# Patient Record
Sex: Female | Born: 1950 | Race: White | Hispanic: No | Smoking: Never smoker
Health system: Southern US, Community
[De-identification: ages and names within clinical notes are randomized; demographics above are authoritative.]

## PROBLEM LIST (undated history)

## (undated) DIAGNOSIS — M797 Fibromyalgia: Secondary | ICD-10-CM

## (undated) DIAGNOSIS — I10 Essential (primary) hypertension: Secondary | ICD-10-CM

---

## 2019-11-30 ENCOUNTER — Ambulatory Visit: Payer: Medicare PPO | Admitting: Dermatology

## 2019-11-30 ENCOUNTER — Encounter: Payer: Self-pay | Admitting: Dermatology

## 2019-11-30 ENCOUNTER — Other Ambulatory Visit: Payer: Self-pay

## 2019-11-30 DIAGNOSIS — I8310 Varicose veins of unspecified lower extremity with inflammation: Secondary | ICD-10-CM

## 2019-11-30 DIAGNOSIS — Z1283 Encounter for screening for malignant neoplasm of skin: Secondary | ICD-10-CM | POA: Diagnosis not present

## 2019-11-30 DIAGNOSIS — L814 Other melanin hyperpigmentation: Secondary | ICD-10-CM | POA: Diagnosis not present

## 2019-11-30 DIAGNOSIS — D1800 Hemangioma unspecified site: Secondary | ICD-10-CM

## 2019-11-30 DIAGNOSIS — L578 Other skin changes due to chronic exposure to nonionizing radiation: Secondary | ICD-10-CM

## 2019-11-30 DIAGNOSIS — L820 Inflamed seborrheic keratosis: Secondary | ICD-10-CM

## 2019-11-30 DIAGNOSIS — D229 Melanocytic nevi, unspecified: Secondary | ICD-10-CM

## 2019-11-30 DIAGNOSIS — L821 Other seborrheic keratosis: Secondary | ICD-10-CM

## 2019-11-30 NOTE — Progress Notes (Signed)
   Follow-Up Visit   Subjective  Susan Kim is a 69 y.o. female who presents for the following: Annual Exam (Pt present for TBSE, no known hx of skin cancer ) and Skin Problem (check lesions on the face and back growing ). The patient presents for Total-Body Skin Exam (TBSE) for skin cancer screening and mole check.  The following portions of the chart were reviewed this encounter and updated as appropriate:  Allergies  Meds  Problems  Med Hx  Surg Hx  Fam Hx     Review of Systems:  No other skin or systemic complaints except as noted in HPI or Assessment and Plan.  Objective  Well appearing patient in no apparent distress; mood and affect are within normal limits.  A full examination was performed including scalp, head, eyes, ears, nose, lips, neck, chest, axillae, abdomen, back, buttocks, bilateral upper extremities, bilateral lower extremities, hands, feet, fingers, toes, fingernails, and toenails. All findings within normal limits unless otherwise noted below.  Objective  Right dorsum wrist, L side, R lower eyelid x 1, L cheek (4): Erythematous keratotic or waxy stuck-on papule or plaque.    Assessment & Plan  Inflamed seborrheic keratosis (4) Right dorsum wrist, L side, R lower eyelid x 1, L cheek  Destruction of lesion - Right dorsum wrist, L side, R lower eyelid x 1, L cheek Complexity: simple   Destruction method: cryotherapy   Informed consent: discussed and consent obtained   Timeout:  patient name, date of birth, surgical site, and procedure verified Lesion destroyed using liquid nitrogen: Yes   Region frozen until ice ball extended beyond lesion: Yes   Outcome: patient tolerated procedure well with no complications   Post-procedure details: wound care instructions given    Skin cancer screening   Lentigines - Scattered tan macules - Discussed due to sun exposure - Benign, observe - Call for any changes  Seborrheic Keratoses - Stuck-on, waxy,  tan-brown papules and plaques  - Discussed benign etiology and prognosis. - Observe - Call for any changes  Melanocytic Nevi - Tan-brown and/or pink-flesh-colored symmetric macules and papules - Benign appearing on exam today - Observation - Call clinic for new or changing moles - Recommend daily use of broad spectrum spf 30+ sunscreen to sun-exposed areas.   Hemangiomas - Red papules - Discussed benign nature - Observe - Call for any changes  Actinic Damage - diffuse scaly erythematous macules with underlying dyspigmentation - Recommend daily broad spectrum sunscreen SPF 30+ to sun-exposed areas, reapply every 2 hours as needed.  - Call for new or changing lesions.  Skin cancer screening performed today.  Varicose Veins - Dilated blue, purple or red veins at the lower extremities - Reassured - These can be treated by sclerotherapy (a procedure to inject a medicine into the veins to make them disappear) if desired, but the treatment is not covered by insurance  Return in about 1 year (around 11/29/2020) for TBSE .  IMarye Round, CMA, am acting as scribe for Sarina Ser, MD .  Documentation: I have reviewed the above documentation for accuracy and completeness, and I agree with the above.  Sarina Ser, MD

## 2019-11-30 NOTE — Patient Instructions (Addendum)
Cryotherapy Aftercare  . Wash gently with soap and water everyday.   . Apply Vaseline and Band-Aid daily until healed. Recommend daily broad spectrum sunscreen SPF 30+ to sun-exposed areas, reapply every 2 hours as needed. Call for new or changing lesions.  

## 2020-06-11 ENCOUNTER — Ambulatory Visit: Payer: Medicare PPO | Admitting: Dermatology

## 2020-06-11 ENCOUNTER — Other Ambulatory Visit: Payer: Self-pay

## 2020-06-11 ENCOUNTER — Encounter: Payer: Self-pay | Admitting: Dermatology

## 2020-06-11 DIAGNOSIS — L820 Inflamed seborrheic keratosis: Secondary | ICD-10-CM

## 2020-06-11 DIAGNOSIS — L821 Other seborrheic keratosis: Secondary | ICD-10-CM

## 2020-06-11 DIAGNOSIS — L578 Other skin changes due to chronic exposure to nonionizing radiation: Secondary | ICD-10-CM | POA: Diagnosis not present

## 2020-06-11 NOTE — Progress Notes (Signed)
   Follow-Up Visit   Subjective  Susan Kim is a 70 y.o. female who presents for the following: Irritated seborrheic keratosis (Of the L cheek - previously tx with LN2, but didn't resolve and patient would like it checked again. ) and new skin lesion (R chest area ).  The following portions of the chart were reviewed this encounter and updated as appropriate:   Allergies  Meds  Problems  Med Hx  Surg Hx  Fam Hx     Review of Systems:  No other skin or systemic complaints except as noted in HPI or Assessment and Plan.  Objective  Well appearing patient in no apparent distress; mood and affect are within normal limits.  A focused examination was performed including the face and chest . Relevant physical exam findings are noted in the Assessment and Plan.  Objective  R lat clavicle x 1, L cheek x 1 (2): Erythematous keratotic or waxy stuck-on papule or plaque.   Assessment & Plan  Inflamed seborrheic keratosis (2) R lat clavicle x 1, L cheek x 1  Destruction of lesion - R lat clavicle x 1, L cheek x 1 Complexity: simple   Destruction method: cryotherapy   Informed consent: discussed and consent obtained   Timeout:  patient name, date of birth, surgical site, and procedure verified Lesion destroyed using liquid nitrogen: Yes   Region frozen until ice ball extended beyond lesion: Yes   Outcome: patient tolerated procedure well with no complications   Post-procedure details: wound care instructions given     Seborrheic Keratoses - Stuck-on, waxy, tan-brown papules and/or plaques  - Benign-appearing - Discussed benign etiology and prognosis. - Observe - Call for any changes  Actinic Damage - chronic, secondary to cumulative UV radiation exposure/sun exposure over time - diffuse scaly erythematous macules with underlying dyspigmentation - Recommend daily broad spectrum sunscreen SPF 30+ to sun-exposed areas, reapply every 2 hours as needed.  - Recommend staying in the  shade or wearing long sleeves, sun glasses (UVA+UVB protection) and wide brim hats (4-inch brim around the entire circumference of the hat). - Call for new or changing lesions.  Return for appointment as scheduled - TBSE .  Luther Redo, CMA, am acting as scribe for Sarina Ser, MD .  Documentation: I have reviewed the above documentation for accuracy and completeness, and I agree with the above.  Sarina Ser, MD

## 2020-06-11 NOTE — Patient Instructions (Signed)

## 2020-08-23 ENCOUNTER — Other Ambulatory Visit: Payer: Self-pay

## 2020-08-23 ENCOUNTER — Emergency Department: Payer: Medicare PPO

## 2020-08-23 ENCOUNTER — Inpatient Hospital Stay
Admission: EM | Admit: 2020-08-23 | Discharge: 2020-08-31 | DRG: 481 | Disposition: A | Discharge status: Home Health | Payer: Medicare PPO | Attending: Obstetrics and Gynecology | Admitting: Obstetrics and Gynecology

## 2020-08-23 DIAGNOSIS — D649 Anemia, unspecified: Secondary | ICD-10-CM | POA: Diagnosis not present

## 2020-08-23 DIAGNOSIS — Z881 Allergy status to other antibiotic agents status: Secondary | ICD-10-CM | POA: Diagnosis not present

## 2020-08-23 DIAGNOSIS — Z419 Encounter for procedure for purposes other than remedying health state, unspecified: Secondary | ICD-10-CM

## 2020-08-23 DIAGNOSIS — K219 Gastro-esophageal reflux disease without esophagitis: Secondary | ICD-10-CM | POA: Diagnosis present

## 2020-08-23 DIAGNOSIS — K5909 Other constipation: Secondary | ICD-10-CM | POA: Diagnosis present

## 2020-08-23 DIAGNOSIS — D62 Acute posthemorrhagic anemia: Secondary | ICD-10-CM | POA: Diagnosis not present

## 2020-08-23 DIAGNOSIS — G4700 Insomnia, unspecified: Secondary | ICD-10-CM

## 2020-08-23 DIAGNOSIS — Z791 Long term (current) use of non-steroidal anti-inflammatories (NSAID): Secondary | ICD-10-CM | POA: Diagnosis not present

## 2020-08-23 DIAGNOSIS — Z91048 Other nonmedicinal substance allergy status: Secondary | ICD-10-CM

## 2020-08-23 DIAGNOSIS — Z885 Allergy status to narcotic agent status: Secondary | ICD-10-CM

## 2020-08-23 DIAGNOSIS — F32A Depression, unspecified: Secondary | ICD-10-CM | POA: Diagnosis present

## 2020-08-23 DIAGNOSIS — I951 Orthostatic hypotension: Secondary | ICD-10-CM | POA: Diagnosis not present

## 2020-08-23 DIAGNOSIS — Z79899 Other long term (current) drug therapy: Secondary | ICD-10-CM | POA: Diagnosis not present

## 2020-08-23 DIAGNOSIS — G8929 Other chronic pain: Secondary | ICD-10-CM | POA: Diagnosis present

## 2020-08-23 DIAGNOSIS — F5101 Primary insomnia: Secondary | ICD-10-CM

## 2020-08-23 DIAGNOSIS — S72141A Displaced intertrochanteric fracture of right femur, initial encounter for closed fracture: Secondary | ICD-10-CM | POA: Diagnosis present

## 2020-08-23 DIAGNOSIS — M25569 Pain in unspecified knee: Secondary | ICD-10-CM | POA: Diagnosis present

## 2020-08-23 DIAGNOSIS — Z20822 Contact with and (suspected) exposure to covid-19: Secondary | ICD-10-CM | POA: Diagnosis present

## 2020-08-23 DIAGNOSIS — W010XXA Fall on same level from slipping, tripping and stumbling without subsequent striking against object, initial encounter: Secondary | ICD-10-CM | POA: Diagnosis present

## 2020-08-23 DIAGNOSIS — M797 Fibromyalgia: Secondary | ICD-10-CM | POA: Diagnosis present

## 2020-08-23 DIAGNOSIS — S72001A Fracture of unspecified part of neck of right femur, initial encounter for closed fracture: Secondary | ICD-10-CM | POA: Diagnosis present

## 2020-08-23 DIAGNOSIS — I10 Essential (primary) hypertension: Secondary | ICD-10-CM

## 2020-08-23 DIAGNOSIS — F3289 Other specified depressive episodes: Secondary | ICD-10-CM | POA: Diagnosis not present

## 2020-08-23 DIAGNOSIS — Y92009 Unspecified place in unspecified non-institutional (private) residence as the place of occurrence of the external cause: Secondary | ICD-10-CM | POA: Diagnosis not present

## 2020-08-23 DIAGNOSIS — Z79890 Hormone replacement therapy (postmenopausal): Secondary | ICD-10-CM | POA: Diagnosis not present

## 2020-08-23 DIAGNOSIS — F411 Generalized anxiety disorder: Secondary | ICD-10-CM | POA: Diagnosis present

## 2020-08-23 DIAGNOSIS — E538 Deficiency of other specified B group vitamins: Secondary | ICD-10-CM | POA: Diagnosis present

## 2020-08-23 HISTORY — DX: Fibromyalgia: M79.7

## 2020-08-23 HISTORY — DX: Essential (primary) hypertension: I10

## 2020-08-23 LAB — CBC WITH DIFFERENTIAL/PLATELET
Abs Immature Granulocytes: 0.02 10*3/uL (ref 0.00–0.07)
Basophils Absolute: 0 10*3/uL (ref 0.0–0.1)
Basophils Relative: 1 %
Eosinophils Absolute: 0.1 10*3/uL (ref 0.0–0.5)
Eosinophils Relative: 1 %
HCT: 37.3 % (ref 36.0–46.0)
Hemoglobin: 12.7 g/dL (ref 12.0–15.0)
Immature Granulocytes: 0 %
Lymphocytes Relative: 16 %
Lymphs Abs: 0.9 10*3/uL (ref 0.7–4.0)
MCH: 32.7 pg (ref 26.0–34.0)
MCHC: 34 g/dL (ref 30.0–36.0)
MCV: 96.1 fL (ref 80.0–100.0)
Monocytes Absolute: 0.3 10*3/uL (ref 0.1–1.0)
Monocytes Relative: 6 %
Neutro Abs: 4.1 10*3/uL (ref 1.7–7.7)
Neutrophils Relative %: 76 %
Platelets: 235 10*3/uL (ref 150–400)
RBC: 3.88 MIL/uL (ref 3.87–5.11)
RDW: 11.9 % (ref 11.5–15.5)
WBC: 5.4 10*3/uL (ref 4.0–10.5)
nRBC: 0 % (ref 0.0–0.2)

## 2020-08-23 LAB — RESP PANEL BY RT-PCR (FLU A&B, COVID) ARPGX2
Influenza A by PCR: NEGATIVE
Influenza B by PCR: NEGATIVE
SARS Coronavirus 2 by RT PCR: NEGATIVE

## 2020-08-23 LAB — BASIC METABOLIC PANEL
Anion gap: 8 (ref 5–15)
BUN: 19 mg/dL (ref 8–23)
CO2: 26 mmol/L (ref 22–32)
Calcium: 8.7 mg/dL — ABNORMAL LOW (ref 8.9–10.3)
Chloride: 103 mmol/L (ref 98–111)
Creatinine, Ser: 0.69 mg/dL (ref 0.44–1.00)
GFR, Estimated: >60 mL/min (ref >60)
Glucose, Bld: 118 mg/dL — ABNORMAL HIGH (ref 70–99)
Potassium: 3.5 mmol/L (ref 3.5–5.1)
Sodium: 137 mmol/L (ref 135–145)

## 2020-08-23 LAB — SURGICAL PCR SCREEN
MRSA, PCR: NEGATIVE
Staphylococcus aureus: NEGATIVE

## 2020-08-23 LAB — TYPE AND SCREEN
ABO/RH(D): A POS
Antibody Screen: NEGATIVE

## 2020-08-23 LAB — PROTIME-INR
INR: 1 (ref 0.8–1.2)
Prothrombin Time: 12.7 seconds (ref 11.4–15.2)

## 2020-08-23 MED ORDER — CEFAZOLIN SODIUM-DEXTROSE 2-4 GM/100ML-% IV SOLN
2.0000 g | INTRAVENOUS | Status: AC
  Administered 2020-08-24: 2 g via INTRAVENOUS

## 2020-08-23 MED ORDER — PANTOPRAZOLE SODIUM 40 MG PO TBEC
80.0000 mg | DELAYED_RELEASE_TABLET | Freq: Every day | ORAL | Status: DC
  Administered 2020-08-24 – 2020-08-31 (×7): 80 mg via ORAL
  Filled 2020-08-23 (×7): qty 2

## 2020-08-23 MED ORDER — MELATONIN 5 MG PO TABS: 5.0000 mg | ORAL_TABLET | Freq: Every day | ORAL | Status: DC

## 2020-08-23 MED ORDER — SENNOSIDES-DOCUSATE SODIUM 8.6-50 MG PO TABS
1.0000 | ORAL_TABLET | Freq: Two times a day (BID) | ORAL | Status: DC
  Administered 2020-08-23: 1 via ORAL
  Filled 2020-08-23: qty 1

## 2020-08-23 MED ORDER — METHOCARBAMOL 500 MG PO TABS: 500.0000 mg | ORAL_TABLET | Freq: Four times a day (QID) | ORAL | Status: DC | PRN

## 2020-08-23 MED ORDER — MORPHINE SULFATE (PF) 4 MG/ML IV SOLN
4.0000 mg | Freq: Once | INTRAVENOUS | Status: AC
  Administered 2020-08-23: 4 mg via INTRAVENOUS
  Filled 2020-08-23: qty 1

## 2020-08-23 MED ORDER — MELATONIN 5 MG PO TABS
5.0000 mg | ORAL_TABLET | Freq: Every day | ORAL | Status: DC
  Administered 2020-08-23 – 2020-08-30 (×8): 5 mg via ORAL
  Filled 2020-08-23 (×8): qty 1

## 2020-08-23 MED ORDER — TRANEXAMIC ACID-NACL 1000-0.7 MG/100ML-% IV SOLN
1000.0000 mg | INTRAVENOUS | Status: AC
  Administered 2020-08-24: 1000 mg via INTRAVENOUS
  Filled 2020-08-23: qty 100

## 2020-08-23 MED ORDER — OXYCODONE-ACETAMINOPHEN 5-325 MG PO TABS: 1.0000 | ORAL_TABLET | Freq: Four times a day (QID) | ORAL | Status: DC | PRN

## 2020-08-23 MED ORDER — DULOXETINE HCL 60 MG PO CPEP
60.0000 mg | ORAL_CAPSULE | Freq: Every day | ORAL | Status: DC
  Administered 2020-08-23: 60 mg via ORAL
  Filled 2020-08-23: qty 1

## 2020-08-23 MED ORDER — BUPROPION HCL ER (XL) 150 MG PO TB24
300.0000 mg | ORAL_TABLET | Freq: Every day | ORAL | Status: DC
  Administered 2020-08-24 – 2020-08-31 (×8): 300 mg via ORAL
  Filled 2020-08-23 (×9): qty 2

## 2020-08-23 MED ORDER — MORPHINE SULFATE (PF) 2 MG/ML IV SOLN
2.0000 mg | Freq: Once | INTRAVENOUS | Status: AC
  Administered 2020-08-23: 2 mg via INTRAVENOUS
  Filled 2020-08-23: qty 1

## 2020-08-23 MED ORDER — CYANOCOBALAMIN 500 MCG PO TABS
500.0000 ug | ORAL_TABLET | Freq: Every day | ORAL | Status: DC
  Filled 2020-08-23: qty 1

## 2020-08-23 MED ORDER — OXYCODONE-ACETAMINOPHEN 5-325 MG PO TABS
1.0000 | ORAL_TABLET | Freq: Four times a day (QID) | ORAL | Status: DC | PRN
  Administered 2020-08-23: 1 via ORAL
  Filled 2020-08-23 (×2): qty 1

## 2020-08-23 MED ORDER — HYDROCODONE-ACETAMINOPHEN 5-325 MG PO TABS: 1.0000 | ORAL_TABLET | Freq: Four times a day (QID) | ORAL | Status: DC | PRN

## 2020-08-23 MED ORDER — HEPARIN SODIUM (PORCINE) 5000 UNIT/ML IJ SOLN
5000.0000 [IU] | Freq: Three times a day (TID) | INTRAMUSCULAR | Status: DC
  Administered 2020-08-23: 5000 [IU] via SUBCUTANEOUS
  Filled 2020-08-23: qty 1

## 2020-08-23 MED ORDER — VITAMIN D 25 MCG (1000 UNIT) PO TABS
2000.0000 [IU] | ORAL_TABLET | Freq: Every day | ORAL | Status: DC
  Administered 2020-08-23: 2000 [IU] via ORAL
  Filled 2020-08-23: qty 2

## 2020-08-23 MED ORDER — POLYETHYLENE GLYCOL 3350 17 G PO PACK: 17.0000 g | PACK | Freq: Every day | ORAL | Status: DC

## 2020-08-23 MED ORDER — ALBUTEROL SULFATE (2.5 MG/3ML) 0.083% IN NEBU: 3.0000 mL | INHALATION_SOLUTION | Freq: Four times a day (QID) | RESPIRATORY_TRACT | Status: DC | PRN

## 2020-08-23 MED ORDER — HYDROCHLOROTHIAZIDE 25 MG PO TABS
25.0000 mg | ORAL_TABLET | Freq: Every day | ORAL | Status: DC
  Administered 2020-08-25 – 2020-08-26 (×2): 25 mg via ORAL
  Filled 2020-08-23 (×3): qty 1

## 2020-08-23 MED ORDER — MELATONIN 5 MG PO TABS: 5.0000 mg | ORAL_TABLET | Freq: Every evening | ORAL | Status: DC | PRN

## 2020-08-23 MED ORDER — FAMOTIDINE 20 MG PO TABS
20.0000 mg | ORAL_TABLET | Freq: Every day | ORAL | Status: DC
  Administered 2020-08-25 – 2020-08-31 (×7): 20 mg via ORAL
  Filled 2020-08-23 (×7): qty 1

## 2020-08-23 MED ORDER — METHOCARBAMOL 500 MG PO TABS
500.0000 mg | ORAL_TABLET | ORAL | Status: DC | PRN
  Administered 2020-08-23: 500 mg via ORAL
  Filled 2020-08-23: qty 1

## 2020-08-23 MED ORDER — MORPHINE SULFATE (PF) 2 MG/ML IV SOLN
1.0000 mg | INTRAVENOUS | Status: DC | PRN
Start: 2020-08-23 — End: 2020-08-24
  Administered 2020-08-23 – 2020-08-24 (×3): 1 mg via INTRAVENOUS
  Filled 2020-08-23 (×3): qty 1

## 2020-08-23 NOTE — ED Triage Notes (Signed)
Pt BIBA from home for a mechanical fall today.Pt tripped over a rug and landed on her right hip. Pt c/o 10/10 right hip pain on arrival. Ems gave 100 mcg fentanyl and 4 mg zofran. Pt denies LOC.

## 2020-08-23 NOTE — H&P (Signed)
History and Physical   Susan Kim XBJ:[id number] DOB: [birth year] DOA: 08/23/2020  PCP: Conni Elliot, MD  Outpatient Specialists: Dr. Frutoso Chase (GI) Patient coming from: home  I have personally briefly reviewed patient's old medical records in [place].  Chief Concern: Mechanical fall  HPI: Susan Kim is a 70 y.o. female with medical history significant for depression, generalized anxiety disorder, insomnia, constipation, migraine headaches, GERD, hypertension, presents to the emergency department for chief concerns of fall.  Patient tripped on her rug that was overturned and fell on her right hip.  She denies loss of consciousness, head trauma, syncope, neck or head pain.  Patient reports that the pain initially was a 10 out of 10 and persistent since her fall.  After pain medication, she states that the pain is 8 out of 10.  Prior to falling she denies chest pain, shortness of breath, abdominal pain, vision changes, weakness.  Social history: She is currently retired, formerly worked as an Therapist, sports. She denies tobacco use. Endorses infrequent etoh and denies recreational drug use.   Vaccinations: three doses of Moderna  ROS: Constitutional: no weight change, no fever ENT/Mouth: no sore throat, no rhinorrhea Eyes: no eye pain, no vision changes Cardiovascular: no chest pain, no dyspnea,  no edema, no palpitations Respiratory: no cough, no sputum, no wheezing Gastrointestinal: no nausea, no vomiting, no diarrhea, no constipation Genitourinary: no urinary incontinence, no dysuria, no hematuria Musculoskeletal: no arthralgias, + myalgias Skin: no skin lesions, no pruritus, Neuro: + weakness, no loss of consciousness, no syncope Psych: no anxiety, no depression, + decrease appetite Heme/Lymph: no bruising, no bleeding  ED Course: Discussed with ED provider, patient requiring hospitalization for right hip fracture.  Vitals in the emergency department initially  showed a temperature of 98.8, respiration rate of 18, heart rate of 80, blood pressure 129/98, SPO2 of 99% on room air.  Patient is status post 100 mcg of fentanyl and 10 mg of IV morphine per EDP.  ED provider called orthopedic surgery, Dr. Posey Pronto who will see the patient.  Assessment/Plan  Active Problems:   Closed right hip fracture, initial encounter [facility])   Essential hypertension   Insomnia   Acute right intertrochanteric fracture-present admission secondary to mechanical fall - Morphine 1 mg every 2 hours as needed for severe pain, oxycodone-acetaminophen 5-3 25, 1 to 2 tablets, daily 6 hours as needed for moderate pain - Cefazolin IV per Ortho - N.p.o. after midnight - Patient will need PT, OT after orthopedic intervention  Depression/anxiety-bupropion 300 mg daily resumed, duloxetine 60 mg nightly resumed  Hypertension-hydrochlorothiazide 25 mg p.o. daily resumed  GERD-famotidine 20 mg daily, pantoprazole 80 mg daily resumed  Chronic constipation-senna docusate 1 tablet twice daily, GlycoLax daily resumed  Insomnia-melatonin nightly ordered  Muscle spasm-Robaxin 500 mg p.o. as needed every 6 hours for muscle spasm  Patient has chronic knee pain and takes daily meloxicam, I have extensively discussed with patient that daily NSAID is not recommended due to risk of GI ulcer - Discussed with patient and son to alternate 1 day taking NSAID in the next day taking acetaminophen -Patient and son endorses understanding and compliance  History of B12 deficiency-B12 500 mcg p.o. Home medication resumed  DVT prophylaxis-I ordered heparin 5000 units, every 8 hours, 2 doses in anticipation of orthopedic procedure in the a.m. - A.m. team to resume pharmacologic DVT prophylaxis when appropriate  Chart reviewed.   DVT prophylaxis: Heparin 5000 units, every 8 hours, 2 doses ordered Code Status: Full code Diet:  Heart healthy, n.p.o. at midnight Family Communication: Updated son at  bedside Disposition Plan: Pending clinical course Consults called: Orthopedic Admission status: MedSurg, inpatient, no telemetry ordered  Past Medical History:  Diagnosis Date   Fibromyalgia    Hypertension    History reviewed. No pertinent surgical history.  Social History:  reports that she has never smoked. She has never used smokeless tobacco. No history on file for alcohol use and drug use.  Allergies  Allergen Reactions   Erythromycin Base Nausea And Vomiting   Hydrocodone-Acetaminophen Nausea And Vomiting and Nausea Only   Other Rash    Silk tape-paper tape OK-slik tape burns and blisters   History reviewed. No pertinent family history. Family history: Family history reviewed and not pertinent  Prior to Admission medications   Medication Sig Start Date End Date Taking? Authorizing Provider  buPROPion (WELLBUTRIN XL) 150 MG 24 hr tablet Take 150 mg by mouth daily.    [provider]  Calcium Carbonate-Vitamin D (OYSTER SHELL CALCIUM/D) 250-125 MG-UNIT TABS Take 1 tablet by mouth daily.    [provider]  DULoxetine (CYMBALTA) 30 MG capsule Take by mouth. 02/18/18   [provider]  famotidine (PEPCID) 20 MG tablet Take by mouth.    [provider]  hydrochlorothiazide (HYDRODIURIL) 25 MG tablet Take 1 tablet by mouth daily. 11/29/19   [provider]  loratadine (CLARITIN) 10 MG tablet Take by mouth.    [provider]  melatonin 1 MG TABS tablet Take by mouth.    [provider]  meloxicam (MOBIC) 15 MG tablet Take by mouth. 04/30/16   [provider]  methocarbamol (ROBAXIN) 500 MG tablet Take by mouth.    [provider]  Misc Natural Products (TURMERIC CURCUMIN) CAPS Take by mouth.    [provider]  ondansetron (ZOFRAN) 4 MG tablet Take by mouth.    [provider]  SUMAtriptan (IMITREX) 25 MG tablet Take by mouth.    [provider]  vitamin B-12  (CYANOCOBALAMIN) 500 MCG tablet Take by mouth.    [provider]  vitamin E 1000 UNIT capsule Take 1,000 Units by mouth daily.    [provider]   Physical Exam: Vitals:   08/23/20 1316 08/23/20 1551 08/23/20 1659 08/23/20 2040  BP: (!) 129/91 (!) 129/98 124/69 (!) 128/57  Pulse: 84 80 87 91  Resp: 15 18 18 18   Temp:  98.8 F (37.1 C) 98 F (36.7 C) 98.1 F (36.7 C)  TempSrc:  Oral Oral Oral  SpO2: 96% 99% 100% 100%  Weight:      Height:       Constitutional: appears age-appropriate, NAD, calm, comfortable Eyes: PERRL, lids and conjunctivae normal ENMT: Mucous membranes are moist. Posterior pharynx clear of any exudate or lesions. Age-appropriate dentition. Hearing appropriate Neck: normal, supple, no masses, no thyromegaly Respiratory: clear to auscultation bilaterally, no wheezing, no crackles. Normal respiratory effort. No accessory muscle use.  Cardiovascular: Regular rate and rhythm, no murmurs / rubs / gallops. No extremity edema. 2+ pedal pulses. No carotid bruits.  Abdomen: Abdominal pannus present, no tenderness, no masses palpated, no hepatosplenomegaly. Bowel sounds positive.  Musculoskeletal: no clubbing / cyanosis. No joint deformity upper and lower extremities. no contractures, no atrophy. Normal muscle tone.  Right hip pain, decreased range of motion in the right lower extremity Skin: no rashes, lesions, ulcers. No induration Neurologic: Sensation intact. Strength 5/5 in all 4.  Psychiatric: Normal judgment and insight. Alert and oriented x 3.  Normal mood.   EKG: Not indicated  Chest x-ray on Admission: I personally reviewed and I agree with radiologist reading as below.  DG Chest 1 View  Result Date: 08/23/2020 CLINICAL DATA:  RIGHT hip fracture, fell today EXAM: CHEST  1 VIEW COMPARISON:  None FINDINGS: Upper normal heart size. Mediastinal contours and pulmonary vascularity normal. Calcified granulomata in both lungs with calcified lymph nodes  at LEFT hilum. Lungs otherwise clear. No acute infiltrate, pleural effusion, or pneumothorax. Bones appear demineralized. IMPRESSION: Old granulomatous disease. No acute abnormalities. Electronically Signed   By: Lavonia Dana M.D.   On: 08/23/2020 14:08   DG Hip Unilat W or Wo Pelvis 2-3 Views Right  Result Date: 08/23/2020 CLINICAL DATA:  Right hip pain after a fall today. EXAM: DG HIP (WITH OR WITHOUT PELVIS) 2-3V RIGHT COMPARISON:  None. FINDINGS: The patient has an acute right intertrochanteric fracture. The fracture is mildly comminuted with the lesser trochanter a separate fragment. No other acute abnormality. IMPRESSION: Acute right intertrochanteric fracture. Electronically Signed   By: Inge Rise M.D.   On: 08/23/2020 14:07    Labs on Admission: I have personally reviewed following labs  CBC: Recent Labs  Lab 08/23/20 1156  WBC 5.4  NEUTROABS 4.1  HGB 12.7  HCT 37.3  MCV 96.1  PLT 810   Basic Metabolic Panel: Recent Labs  Lab 08/23/20 1156  NA 137  K 3.5  CL 103  CO2 26  GLUCOSE 118*  BUN 19  CREATININE 0.69  CALCIUM 8.7*   GFR: Estimated Creatinine Clearance: 62 mL/min (by C-G formula based on SCr of 0.69 mg/dL).  Recent Labs  Lab 08/23/20 1156  INR 1.0   Leetta Hendriks N Monaca Wadas D.O. [facility]  If 7PM-7AM, please contact overnight-coverage provider If 7AM-7PM, please contact day coverage provider [email or web address]  08/23/2020, 11:43 PM

## 2020-08-23 NOTE — ED Provider Notes (Signed)
[facility] Emergency Department Provider Note  ____________________________________________   Event Date/Time   First MD Initiated Contact with Patient 08/23/20 1148     (approximate)  I have reviewed the triage vital signs and the nursing notes.   HISTORY  Chief Complaint Fall   HPI Susan Kim is a 70 y.o. female with a past medical history of fibromyalgia and hypertension who presents via EMS from home for assessment of right hip pain after patient states she slipped on a rug falling onto her right hip.  Patient states she did not hit anywhere else including her right knee left hip, ankles, arms, head or neck.  States she did not pass out and has not been feeling dizzy, weak, lightheaded and has not had any recent chest pain, cough, shortness of breath, vomiting, diarrhea, dysuria, rash or any other recent injuries or falls.  She is not on any blood thinners.  She denies any other pain or signs or symptoms other than in her right hip.  She did receive 100 mcg of fentanyl and 4 mg of Zofran with EMS prior to arrival.  She states her pain is still 10/10 intensity.         Past Medical History:  Diagnosis Date   Fibromyalgia    Hypertension     There are no problems to display for this patient.   History reviewed. No pertinent surgical history.  Prior to Admission medications   Medication Sig Start Date End Date Taking? Authorizing Provider  buPROPion (WELLBUTRIN XL) 150 MG 24 hr tablet Take 150 mg by mouth daily.    [provider]  Calcium Carbonate-Vitamin D (OYSTER SHELL CALCIUM/D) 250-125 MG-UNIT TABS Take 1 tablet by mouth daily.    [provider]  DULoxetine (CYMBALTA) 30 MG capsule Take by mouth. 02/18/18   [provider]  famotidine (PEPCID) 20 MG tablet Take by mouth.    [provider]  hydrochlorothiazide (HYDRODIURIL) 25 MG tablet Take 1 tablet by mouth daily. 11/29/19   [provider]   loratadine (CLARITIN) 10 MG tablet Take by mouth.    [provider]  melatonin 1 MG TABS tablet Take by mouth.    [provider]  meloxicam (MOBIC) 15 MG tablet Take by mouth. 04/30/16   [provider]  methocarbamol (ROBAXIN) 500 MG tablet Take by mouth.    [provider]  Misc Natural Products (TURMERIC CURCUMIN) CAPS Take by mouth.    [provider]  ondansetron (ZOFRAN) 4 MG tablet Take by mouth.    [provider]  SUMAtriptan (IMITREX) 25 MG tablet Take by mouth.    [provider]  vitamin B-12 (CYANOCOBALAMIN) 500 MCG tablet Take by mouth.    [provider]  vitamin E 1000 UNIT capsule Take 1,000 Units by mouth daily.    [provider]    Allergies Erythromycin base, Hydrocodone-acetaminophen, and Other  History reviewed. No pertinent family history.  Social History Social History   Tobacco Use   Smoking status: Never   Smokeless tobacco: Never    Review of Systems  Review of Systems  Constitutional:  Negative for chills and fever.  HENT:  Negative for sore throat.   Eyes:  Negative for pain.  Respiratory:  Negative for cough and stridor.   Cardiovascular:  Negative for chest pain.  Gastrointestinal:  Negative for vomiting.  Genitourinary:  Negative for dysuria.  Musculoskeletal:  Positive for joint pain (R hip) and myalgias (R hip).  Skin:  Negative for rash.  Neurological:  Negative for seizures, loss of consciousness and headaches.  Psychiatric/Behavioral:  Negative for suicidal ideas.   All other systems reviewed and are negative.    ____________________________________________   PHYSICAL EXAM:  VITAL SIGNS: ED Triage Vitals  Enc Vitals Group     BP 08/23/20 1154 (!) 156/81     Pulse Rate 08/23/20 1154 81     Resp 08/23/20 1154 17     Temp 08/23/20 1154 98.1 F (36.7 C)     Temp Source 08/23/20 1154 Oral     SpO2 08/23/20 1154 99 %     Weight 08/23/20 1152 175  lb (79.4 kg)     Height 08/23/20 1152 5\' 2"  (1.575 m)     Head Circumference --      Peak Flow --      Pain Score 08/23/20 1152 10     Pain Loc --      Pain Edu? --      Excl. in [place]? --    Vitals:   08/23/20 1230 08/23/20 1316  BP: (!) 142/67 (!) 129/91  Pulse: 80 84  Resp: 17 15  Temp:    SpO2: 100% 96%   Physical Exam Vitals and nursing note reviewed.  Constitutional:      General: She is not in acute distress.    Appearance: She is well-developed.  HENT:     Head: Normocephalic and atraumatic.     Right Ear: External ear normal.     Left Ear: External ear normal.     Nose: Nose normal.  Eyes:     Conjunctiva/sclera: Conjunctivae normal.  Cardiovascular:     Rate and Rhythm: Normal rate and regular rhythm.     Heart sounds: No murmur heard. Pulmonary:     Effort: Pulmonary effort is normal. No respiratory distress.     Breath sounds: Normal breath sounds.  Abdominal:     Palpations: Abdomen is soft.     Tenderness: There is no abdominal tenderness.  Musculoskeletal:     Cervical back: Neck supple.     Right hip: Tenderness present. Decreased range of motion. Decreased strength.  Skin:    General: Skin is warm and dry.  Neurological:     Mental Status: She is alert.    2+ radial pulse.  Patient has symmetric strength in her bilateral upper extremities and full strength in the left lower extremity.  She is able move her toes and her right lower extremity.  She is able to flex or extend at the right hip and she is to move or attempt to flex extend at the right knee.  2+ DP pulses.  Sensation intact to light touch in all extremities.  PERRLA.  EOMI.  No tenderness to office deformities over the C-spine or lower back. ____________________________________________   LABS (all labs ordered are listed, but only abnormal results are displayed)  Labs Reviewed  BASIC METABOLIC PANEL - Abnormal; Notable for the following components:      Result Value   Glucose, Bld 118 (*)     Calcium 8.7 (*)    All other components within normal limits  RESP PANEL BY RT-PCR (FLU A&B, COVID) ARPGX2  CBC WITH DIFFERENTIAL/PLATELET  PROTIME-INR  TYPE AND SCREEN   ____________________________________________  EKG  ____________________________________________  RADIOLOGY  ED MD interpretation: Chest x-ray obtained for preop purposes shows old granulomatous disease without any fracture, no thorax, focal consolidation, effusion, edema or other clear acute intrathoracic process.  Plain film of the right hip shows a closed right intertrochanteric fracture.  Official radiology report(s): DG Chest 1 View  Result Date: 08/23/2020 CLINICAL DATA:  RIGHT hip fracture, fell today EXAM: CHEST  1 VIEW COMPARISON:  None FINDINGS: Upper normal heart size. Mediastinal contours and pulmonary vascularity normal. Calcified granulomata in both lungs with calcified lymph nodes at LEFT hilum. Lungs otherwise clear. No acute infiltrate, pleural effusion, or pneumothorax. Bones appear demineralized. IMPRESSION: Old granulomatous disease. No acute abnormalities. Electronically Signed   By: Lavonia Dana M.D.   On: 08/23/2020 14:08   DG Hip Unilat W or Wo Pelvis 2-3 Views Right  Result Date: 08/23/2020 CLINICAL DATA:  Right hip pain after a fall today. EXAM: DG HIP (WITH OR WITHOUT PELVIS) 2-3V RIGHT COMPARISON:  None. FINDINGS: The patient has an acute right intertrochanteric fracture. The fracture is mildly comminuted with the lesser trochanter a separate fragment. No other acute abnormality. IMPRESSION: Acute right intertrochanteric fracture. Electronically Signed   By: Inge Rise M.D.   On: 08/23/2020 14:07    ____________________________________________   PROCEDURES  Procedure(s) performed (including Critical Care):  .1-3 Lead EKG Interpretation  Date/Time: 08/23/2020 2:32 PM Performed by: Lucrezia Starch, MD Authorized by: Lucrezia Starch, MD     Interpretation: normal     ECG rate  assessment: normal     Rhythm: sinus rhythm     Ectopy: none     Conduction: normal     ____________________________________________   INITIAL IMPRESSION / ASSESSMENT AND PLAN / ED COURSE      Patient presents with above-stated history and exam for assessment of right hip pain after ground-level mechanical fall described above.  On arrival she is afebrile and hemodynamically stable.  She is weak and quite tender in her right hip.  She denies any other acute symptoms and has no other areas of weakness or obvious injury on exam.  Differential includes possible hip fracture, contusion, hematoma and musculoskeletal strain.   CBC is unremarkable.  BMP is unremarkable.  INR is WNL. Right hip plain film shows closed right intertrochanteric fracture.  Preop chest x-ray is unremarkable.  Discussed above-noted fracture with on-call orthopedist Dr. Posey Pronto who recommended hospital admission with plan for surgical repair tomorrow.  Patient admitted to hospital service for further evaluation and management.     ____________________________________________   FINAL CLINICAL IMPRESSION(S) / ED DIAGNOSES  Final diagnoses:  Closed fracture of right hip, initial encounter ([place])    Medications  morphine 2 MG/ML injection 2 mg (has no administration in time range)  morphine 4 MG/ML injection 4 mg (4 mg Intravenous Given 08/23/20 1203)  morphine 4 MG/ML injection 4 mg (4 mg Intravenous Given 08/23/20 1310)     ED Discharge Orders     None        Note:  This document was prepared using Dragon voice recognition software and may include unintentional dictation errors.    Lucrezia Starch, MD 08/23/20 [phone]

## 2020-08-23 NOTE — ED Notes (Signed)
Patient transported to X-ray 

## 2020-08-23 NOTE — Consult Note (Signed)
ORTHOPAEDIC CONSULTATION  REQUESTING PHYSICIAN: Lucrezia Starch, MD  Chief Complaint:   R hip pain  History of Present Illness: Susan Kim is a 70 y.o. female who had a fall at home earlier today.  The patient noted immediate hip pain and inability to ambulate.  The patient ambulates unassisted at baseline.  The patient lives alone at home.  Pain is worse with any sort of movement.  X-rays in the emergency department show a right intertrochanteric hip fracture.  She is otherwise relatively healthy and has a medical history significant for hypertension and fibromyalgia.  Past Medical History:  Diagnosis Date   Fibromyalgia    Hypertension    History reviewed. No pertinent surgical history. Social History   Socioeconomic History   Marital status: Widowed    Spouse name: Not on file   Number of children: Not on file   Years of education: Not on file   Highest education level: Not on file  Occupational History   Not on file  Tobacco Use   Smoking status: Never   Smokeless tobacco: Never  Substance and Sexual Activity   Alcohol use: Not on file   Drug use: Not on file   Sexual activity: Not on file  Other Topics Concern   Not on file  Social History Narrative   Not on file   Social Determinants of Health   Financial Resource Strain: Not on file  Food Insecurity: Not on file  Transportation Needs: Not on file  Physical Activity: Not on file  Stress: Not on file  Social Connections: Not on file   History reviewed. No pertinent family history. Allergies  Allergen Reactions   Erythromycin Base Nausea And Vomiting   Hydrocodone-Acetaminophen Nausea And Vomiting and Nausea Only   Other Rash    Silk tape-paper tape OK-slik tape burns and blisters   Prior to Admission medications   Medication Sig Start Date End Date Taking? Authorizing Provider  buPROPion (WELLBUTRIN XL) 150 MG 24 hr tablet Take 150 mg  by mouth daily.    [provider]  Calcium Carbonate-Vitamin D (OYSTER SHELL CALCIUM/D) 250-125 MG-UNIT TABS Take 1 tablet by mouth daily.    [provider]  DULoxetine (CYMBALTA) 30 MG capsule Take by mouth. 02/18/18   [provider]  famotidine (PEPCID) 20 MG tablet Take by mouth.    [provider]  hydrochlorothiazide (HYDRODIURIL) 25 MG tablet Take 1 tablet by mouth daily. 11/29/19   [provider]  loratadine (CLARITIN) 10 MG tablet Take by mouth.    [provider]  melatonin 1 MG TABS tablet Take by mouth.    [provider]  meloxicam (MOBIC) 15 MG tablet Take by mouth. 04/30/16   [provider]  methocarbamol (ROBAXIN) 500 MG tablet Take by mouth.    [provider]  Misc Natural Products (TURMERIC CURCUMIN) CAPS Take by mouth.    [provider]  ondansetron (ZOFRAN) 4 MG tablet Take by mouth.    [provider]  SUMAtriptan (IMITREX) 25 MG tablet Take by mouth.    [provider]  vitamin B-12 (CYANOCOBALAMIN) 500 MCG tablet Take by mouth.    [provider]  vitamin E 1000 UNIT capsule Take 1,000 Units by mouth daily.    [provider]   Recent Labs    08/23/20 1156  WBC 5.4  HGB 12.7  HCT 37.3  PLT 235  K 3.5  CL 103  CO2 26  BUN 19  CREATININE  0.69  GLUCOSE 118*  CALCIUM 8.7*  INR 1.0   DG Chest 1 View  Result Date: 08/23/2020 CLINICAL DATA:  RIGHT hip fracture, fell today EXAM: CHEST  1 VIEW COMPARISON:  None FINDINGS: Upper normal heart size. Mediastinal contours and pulmonary vascularity normal. Calcified granulomata in both lungs with calcified lymph nodes at LEFT hilum. Lungs otherwise clear. No acute infiltrate, pleural effusion, or pneumothorax. Bones appear demineralized. IMPRESSION: Old granulomatous disease. No acute abnormalities. Electronically Signed   By: Lavonia Dana M.D.   On: 08/23/2020 14:08   DG Hip Unilat W or Wo Pelvis  2-3 Views Right  Result Date: 08/23/2020 CLINICAL DATA:  Right hip pain after a fall today. EXAM: DG HIP (WITH OR WITHOUT PELVIS) 2-3V RIGHT COMPARISON:  None. FINDINGS: The patient has an acute right intertrochanteric fracture. The fracture is mildly comminuted with the lesser trochanter a separate fragment. No other acute abnormality. IMPRESSION: Acute right intertrochanteric fracture. Electronically Signed   By: Inge Rise M.D.   On: 08/23/2020 14:07     Positive ROS: All other systems have been reviewed and were otherwise negative with the exception of those mentioned in the HPI and as above.  Physical Exam: BP (!) 129/91   Pulse 84   Temp 98.1 F (36.7 C) (Oral)   Resp 15   Ht 5\' 2"  (1.575 m)   Wt 79.4 kg   SpO2 96%   BMI 32.01 kg/m  General:  Alert, no acute distress Psychiatric:  Patient is competent for consent with normal mood and affect   Cardiovascular:  No pedal edema, regular rate and rhythm Respiratory:  No wheezing, non-labored breathing GI:  Abdomen is soft and non-tender Skin:  No lesions in the area of chief complaint, no erythema Neurologic:  Sensation intact distally, CN grossly intact Lymphatic:  No axillary or cervical lymphadenopathy  Orthopedic Exam:  RLE: 5/5 DF/PF/EHL SILT s/s/t/sp/dp distr Foot wwp +Log roll/axial load   X-rays:  As above: R intertrochanteric hip fracture  Assessment/Plan: Susan Kim is a 70 y.o. female with a R intertrochanteric hip fracture   1. I discussed the various treatment options including both surgical and non-surgical management of the fracture with the patient and her son at the bedside today.  We discussed the high risk of perioperative complications due to patient's age and other co-morbidities. After discussion of risks, benefits, and alternatives to surgery, the family and patient were in agreement to proceed with surgery. The goals of surgery would be to provide adequate pain relief and allow for  mobilization. Plan for surgery is R hip cephalomedullary nailing tomorrow, 08/24/20. 2. NPO after midnight 3. Hold anticoagulation in advance of OR 4. Admit to [facility]   08/23/2020 2:33 PM

## 2020-08-24 ENCOUNTER — Encounter: Payer: Self-pay | Admitting: Internal Medicine

## 2020-08-24 ENCOUNTER — Inpatient Hospital Stay: Payer: Medicare PPO | Admitting: Anesthesiology

## 2020-08-24 ENCOUNTER — Inpatient Hospital Stay: Payer: Medicare PPO

## 2020-08-24 ENCOUNTER — Encounter
Admission: EM | Disposition: A | Discharge status: Home Health | Payer: Self-pay | Source: Home / Self Care | Attending: Internal Medicine

## 2020-08-24 DIAGNOSIS — K219 Gastro-esophageal reflux disease without esophagitis: Secondary | ICD-10-CM

## 2020-08-24 HISTORY — PX: INTRAMEDULLARY (IM) NAIL INTERTROCHANTERIC: SHX5875

## 2020-08-24 LAB — BASIC METABOLIC PANEL
Anion gap: 5 (ref 5–15)
BUN: 21 mg/dL (ref 8–23)
CO2: 28 mmol/L (ref 22–32)
Calcium: 8.4 mg/dL — ABNORMAL LOW (ref 8.9–10.3)
Chloride: 99 mmol/L (ref 98–111)
Creatinine, Ser: 0.72 mg/dL (ref 0.44–1.00)
GFR, Estimated: >60 mL/min (ref >60)
Glucose, Bld: 123 mg/dL — ABNORMAL HIGH (ref 70–99)
Potassium: 3.4 mmol/L — ABNORMAL LOW (ref 3.5–5.1)
Sodium: 132 mmol/L — ABNORMAL LOW (ref 135–145)

## 2020-08-24 LAB — CBC
HCT: 33.4 % — ABNORMAL LOW (ref 36.0–46.0)
Hemoglobin: 11.3 g/dL — ABNORMAL LOW (ref 12.0–15.0)
MCH: 32.5 pg (ref 26.0–34.0)
MCHC: 33.8 g/dL (ref 30.0–36.0)
MCV: 96 fL (ref 80.0–100.0)
Platelets: 215 10*3/uL (ref 150–400)
RBC: 3.48 MIL/uL — ABNORMAL LOW (ref 3.87–5.11)
RDW: 11.9 % (ref 11.5–15.5)
WBC: 5.9 10*3/uL (ref 4.0–10.5)
nRBC: 0 % (ref 0.0–0.2)

## 2020-08-24 SURGERY — FIXATION, FRACTURE, INTERTROCHANTERIC, WITH INTRAMEDULLARY ROD
Anesthesia: Spinal | Laterality: Right

## 2020-08-24 MED ORDER — GLYCOPYRROLATE 0.2 MG/ML IJ SOLN
INTRAMUSCULAR | Status: AC
  Filled 2020-08-24: qty 1

## 2020-08-24 MED ORDER — PROPOFOL 500 MG/50ML IV EMUL
INTRAVENOUS | Status: DC | PRN
  Administered 2020-08-24: 40 ug/kg/min via INTRAVENOUS
  Administered 2020-08-24: 20 ug/kg/min via INTRAVENOUS

## 2020-08-24 MED ORDER — POTASSIUM CHLORIDE CRYS ER 20 MEQ PO TBCR
20.0000 meq | EXTENDED_RELEASE_TABLET | Freq: Once | ORAL | Status: AC
  Administered 2020-08-24: 20 meq via ORAL
  Filled 2020-08-24: qty 1

## 2020-08-24 MED ORDER — ENOXAPARIN SODIUM 40 MG/0.4ML IJ SOSY
40.0000 mg | PREFILLED_SYRINGE | INTRAMUSCULAR | Status: DC
  Administered 2020-08-25 – 2020-08-31 (×7): 40 mg via SUBCUTANEOUS
  Filled 2020-08-24 (×7): qty 0.4

## 2020-08-24 MED ORDER — CEFAZOLIN SODIUM-DEXTROSE 2-4 GM/100ML-% IV SOLN
INTRAVENOUS | Status: AC
  Administered 2020-08-24: 2 g via INTRAVENOUS
  Filled 2020-08-24: qty 100

## 2020-08-24 MED ORDER — ONDANSETRON HCL 4 MG PO TABS
4.0000 mg | ORAL_TABLET | Freq: Four times a day (QID) | ORAL | Status: DC | PRN
  Administered 2020-08-26 – 2020-08-29 (×2): 4 mg via ORAL
  Filled 2020-08-24 (×2): qty 1

## 2020-08-24 MED ORDER — PHENYLEPHRINE HCL (PRESSORS) 10 MG/ML IV SOLN
INTRAVENOUS | Status: AC
  Filled 2020-08-24: qty 1

## 2020-08-24 MED ORDER — BUPROPION HCL ER (XL) 300 MG PO TB24: 300.0000 mg | ORAL_TABLET | Freq: Every morning | ORAL | Status: DC

## 2020-08-24 MED ORDER — LACTATED RINGERS IV SOLN: INTRAVENOUS | Status: DC | PRN

## 2020-08-24 MED ORDER — ONDANSETRON HCL 4 MG/2ML IJ SOLN: 4.0000 mg | Freq: Four times a day (QID) | INTRAMUSCULAR | Status: DC | PRN

## 2020-08-24 MED ORDER — FENTANYL CITRATE (PF) 100 MCG/2ML IJ SOLN
INTRAMUSCULAR | Status: AC
  Filled 2020-08-24: qty 2

## 2020-08-24 MED ORDER — SODIUM CHLORIDE 0.9 % IR SOLN
Status: DC | PRN
  Administered 2020-08-24: 500 mL

## 2020-08-24 MED ORDER — DULOXETINE HCL 60 MG PO CPEP
60.0000 mg | ORAL_CAPSULE | Freq: Every day | ORAL | Status: DC
  Administered 2020-08-24 – 2020-08-30 (×7): 60 mg via ORAL
  Filled 2020-08-24 (×8): qty 1

## 2020-08-24 MED ORDER — TRAMADOL HCL 50 MG PO TABS
50.0000 mg | ORAL_TABLET | Freq: Four times a day (QID) | ORAL | Status: DC | PRN
  Administered 2020-08-25 – 2020-08-31 (×11): 50 mg via ORAL
  Filled 2020-08-24 (×11): qty 1

## 2020-08-24 MED ORDER — BUPIVACAINE HCL (PF) 0.5 % IJ SOLN
INTRAMUSCULAR | Status: AC
  Filled 2020-08-24: qty 10

## 2020-08-24 MED ORDER — FENTANYL CITRATE (PF) 100 MCG/2ML IJ SOLN: 25.0000 ug | INTRAMUSCULAR | Status: DC | PRN

## 2020-08-24 MED ORDER — ONDANSETRON HCL 4 MG/2ML IJ SOLN
INTRAMUSCULAR | Status: DC | PRN
  Administered 2020-08-24: 4 mg via INTRAVENOUS

## 2020-08-24 MED ORDER — KETOROLAC TROMETHAMINE 15 MG/ML IJ SOLN
7.5000 mg | Freq: Four times a day (QID) | INTRAMUSCULAR | Status: AC
  Administered 2020-08-24 – 2020-08-25 (×4): 7.5 mg via INTRAVENOUS
  Filled 2020-08-24 (×4): qty 1

## 2020-08-24 MED ORDER — BISACODYL 10 MG RE SUPP
10.0000 mg | Freq: Every day | RECTAL | Status: DC | PRN
  Filled 2020-08-24: qty 1

## 2020-08-24 MED ORDER — PHENYLEPHRINE HCL (PRESSORS) 10 MG/ML IV SOLN
INTRAVENOUS | Status: DC | PRN
  Administered 2020-08-24: 200 ug via INTRAVENOUS
  Administered 2020-08-24: 100 ug via INTRAVENOUS
  Administered 2020-08-24 (×3): 200 ug via INTRAVENOUS

## 2020-08-24 MED ORDER — PHENYLEPHRINE HCL-NACL 10-0.9 MG/250ML-% IV SOLN
0.0000 ug/min | INTRAVENOUS | Status: DC
  Filled 2020-08-24: qty 250

## 2020-08-24 MED ORDER — METOCLOPRAMIDE HCL 5 MG/ML IJ SOLN: 5.0000 mg | Freq: Three times a day (TID) | INTRAMUSCULAR | Status: DC | PRN

## 2020-08-24 MED ORDER — PROPOFOL 500 MG/50ML IV EMUL
INTRAVENOUS | Status: AC
  Filled 2020-08-24: qty 50

## 2020-08-24 MED ORDER — BUPIVACAINE HCL (PF) 0.5 % IJ SOLN
INTRAMUSCULAR | Status: AC
  Filled 2020-08-24: qty 30

## 2020-08-24 MED ORDER — DEXAMETHASONE SODIUM PHOSPHATE 10 MG/ML IJ SOLN
INTRAMUSCULAR | Status: DC | PRN
  Administered 2020-08-24: 10 mg via INTRAVENOUS

## 2020-08-24 MED ORDER — OXYCODONE HCL 5 MG PO TABS
5.0000 mg | ORAL_TABLET | ORAL | Status: DC | PRN
  Administered 2020-08-24 – 2020-08-25 (×4): 5 mg via ORAL
  Administered 2020-08-25 – 2020-08-27 (×6): 10 mg via ORAL
  Administered 2020-08-28 – 2020-08-29 (×7): 5 mg via ORAL
  Administered 2020-08-29 – 2020-08-31 (×7): 10 mg via ORAL
  Filled 2020-08-24: qty 1
  Filled 2020-08-24: qty 2
  Filled 2020-08-24: qty 1
  Filled 2020-08-24 (×4): qty 2
  Filled 2020-08-24 (×3): qty 1
  Filled 2020-08-24 (×2): qty 2
  Filled 2020-08-24 (×2): qty 1
  Filled 2020-08-24: qty 2
  Filled 2020-08-24 (×2): qty 1
  Filled 2020-08-24: qty 2
  Filled 2020-08-24: qty 1
  Filled 2020-08-24: qty 2
  Filled 2020-08-24 (×2): qty 1
  Filled 2020-08-24 (×4): qty 2

## 2020-08-24 MED ORDER — METOCLOPRAMIDE HCL 10 MG PO TABS: 5.0000 mg | ORAL_TABLET | Freq: Three times a day (TID) | ORAL | Status: DC | PRN

## 2020-08-24 MED ORDER — SIMETHICONE 80 MG PO CHEW
80.0000 mg | CHEWABLE_TABLET | Freq: Two times a day (BID) | ORAL | Status: DC
  Administered 2020-08-24 – 2020-08-31 (×15): 80 mg via ORAL
  Filled 2020-08-24 (×16): qty 1

## 2020-08-24 MED ORDER — BUPIVACAINE LIPOSOME 1.3 % IJ SUSP
INTRAMUSCULAR | Status: DC | PRN
  Administered 2020-08-24: 20 mL

## 2020-08-24 MED ORDER — ONDANSETRON HCL 4 MG/2ML IJ SOLN
INTRAMUSCULAR | Status: AC
  Filled 2020-08-24: qty 2

## 2020-08-24 MED ORDER — ACETAMINOPHEN 500 MG PO TABS
1000.0000 mg | ORAL_TABLET | Freq: Three times a day (TID) | ORAL | Status: AC
  Administered 2020-08-24 – 2020-08-25 (×4): 1000 mg via ORAL
  Filled 2020-08-24 (×4): qty 2

## 2020-08-24 MED ORDER — SENNOSIDES-DOCUSATE SODIUM 8.6-50 MG PO TABS: 1.0000 | ORAL_TABLET | Freq: Every evening | ORAL | Status: DC | PRN

## 2020-08-24 MED ORDER — FENTANYL CITRATE (PF) 100 MCG/2ML IJ SOLN
INTRAMUSCULAR | Status: DC | PRN
  Administered 2020-08-24 (×2): 50 ug via INTRAVENOUS

## 2020-08-24 MED ORDER — BUPIVACAINE HCL (PF) 0.5 % IJ SOLN
INTRAMUSCULAR | Status: DC | PRN
  Administered 2020-08-24: 2.5 mL via INTRATHECAL

## 2020-08-24 MED ORDER — SODIUM CHLORIDE 0.9 % IV SOLN: INTRAVENOUS | Status: DC

## 2020-08-24 MED ORDER — TRANEXAMIC ACID 1000 MG/10ML IV SOLN
INTRAVENOUS | Status: AC
  Filled 2020-08-24: qty 10

## 2020-08-24 MED ORDER — ENSURE MAX PROTEIN PO LIQD
11.0000 [oz_av] | Freq: Two times a day (BID) | ORAL | Status: DC
  Administered 2020-08-24 – 2020-08-31 (×13): 11 [oz_av] via ORAL
  Filled 2020-08-24: qty 330

## 2020-08-24 MED ORDER — ONDANSETRON HCL 4 MG/2ML IJ SOLN
4.0000 mg | Freq: Once | INTRAMUSCULAR | Status: DC | PRN
Start: 2020-08-24 — End: 2020-08-24

## 2020-08-24 MED ORDER — CHLORHEXIDINE GLUCONATE CLOTH 2 % EX PADS
6.0000 | MEDICATED_PAD | Freq: Every day | CUTANEOUS | Status: DC
  Administered 2020-08-24: 6 via TOPICAL

## 2020-08-24 MED ORDER — MIDAZOLAM HCL 5 MG/5ML IJ SOLN
INTRAMUSCULAR | Status: DC | PRN
  Administered 2020-08-24 (×2): 1 mg via INTRAVENOUS

## 2020-08-24 MED ORDER — SODIUM CHLORIDE 0.9 % IV SOLN
INTRAVENOUS | Status: DC | PRN
  Administered 2020-08-24: 20 ug/min via INTRAVENOUS

## 2020-08-24 MED ORDER — LIDOCAINE HCL (PF) 2 % IJ SOLN
INTRAMUSCULAR | Status: AC
  Filled 2020-08-24: qty 5

## 2020-08-24 MED ORDER — METHOCARBAMOL 500 MG PO TABS
500.0000 mg | ORAL_TABLET | Freq: Four times a day (QID) | ORAL | Status: DC | PRN
  Administered 2020-08-24 – 2020-08-31 (×17): 500 mg via ORAL
  Filled 2020-08-24 (×17): qty 1

## 2020-08-24 MED ORDER — METHOCARBAMOL 1000 MG/10ML IJ SOLN
500.0000 mg | Freq: Four times a day (QID) | INTRAVENOUS | Status: DC | PRN
  Filled 2020-08-24: qty 5

## 2020-08-24 MED ORDER — DOCUSATE SODIUM 100 MG PO CAPS
100.0000 mg | ORAL_CAPSULE | Freq: Two times a day (BID) | ORAL | Status: DC
  Administered 2020-08-24: 100 mg via ORAL
  Filled 2020-08-24 (×2): qty 1

## 2020-08-24 MED ORDER — MIDAZOLAM HCL 2 MG/2ML IJ SOLN
INTRAMUSCULAR | Status: AC
  Filled 2020-08-24: qty 2

## 2020-08-24 MED ORDER — FLEET ENEMA 7-19 GM/118ML RE ENEM: 1.0000 | ENEMA | Freq: Once | RECTAL | Status: DC | PRN

## 2020-08-24 MED ORDER — NEOMYCIN-POLYMYXIN B GU 40-200000 IR SOLN
Status: AC
  Filled 2020-08-24: qty 4

## 2020-08-24 MED ORDER — EPHEDRINE SULFATE 50 MG/ML IJ SOLN
INTRAMUSCULAR | Status: DC | PRN
  Administered 2020-08-24: 10 mg via INTRAVENOUS
  Administered 2020-08-24: 5 mg via INTRAVENOUS

## 2020-08-24 MED ORDER — BUPIVACAINE LIPOSOME 1.3 % IJ SUSP
INTRAMUSCULAR | Status: AC
  Filled 2020-08-24: qty 20

## 2020-08-24 MED ORDER — OXYCODONE HCL 5 MG PO TABS
2.5000 mg | ORAL_TABLET | ORAL | Status: DC | PRN
  Administered 2020-08-27 – 2020-08-31 (×3): 5 mg via ORAL
  Filled 2020-08-24: qty 1

## 2020-08-24 MED ORDER — BUPIVACAINE HCL (PF) 0.5 % IJ SOLN
INTRAMUSCULAR | Status: DC | PRN
  Administered 2020-08-24: 30 mL

## 2020-08-24 MED ORDER — CEFAZOLIN SODIUM-DEXTROSE 2-4 GM/100ML-% IV SOLN
2.0000 g | Freq: Four times a day (QID) | INTRAVENOUS | Status: AC
  Administered 2020-08-24 – 2020-08-25 (×2): 2 g via INTRAVENOUS
  Filled 2020-08-24 (×3): qty 100

## 2020-08-24 MED ORDER — HYDROMORPHONE HCL 1 MG/ML IJ SOLN: 0.2000 mg | INTRAMUSCULAR | Status: DC | PRN

## 2020-08-24 MED ORDER — ACETAMINOPHEN 10 MG/ML IV SOLN: 1000.0000 mg | Freq: Once | INTRAVENOUS | Status: DC | PRN

## 2020-08-24 SURGICAL SUPPLY — 51 items
"PENCIL ELECTRO HAND CTR " (MISCELLANEOUS) ×1 IMPLANT
BIT DRILL INTERTAN LAG SCREW (BIT) ×1 IMPLANT
BIT DRILL SHORT 4.0 (BIT) IMPLANT
BLADE SURG 15 STRL LF DISP TIS (BLADE) ×1 IMPLANT
BLADE SURG 15 STRL SS (BLADE) ×1
BNDG COHESIVE 6X5 TAN STRL LF (GAUZE/BANDAGES/DRESSINGS) ×1 IMPLANT
CHLORAPREP W/TINT 26 (MISCELLANEOUS) ×2 IMPLANT
COVER WAND RF STERILE (DRAPES) ×2 IMPLANT
DRAPE 3/4 80X56 (DRAPES) ×2 IMPLANT
DRAPE SURG 17X11 SM STRL (DRAPES) ×4 IMPLANT
DRAPE U-SHAPE 47X51 STRL (DRAPES) ×4 IMPLANT
DRILL BIT SHORT 4.0 (BIT) ×1
DRSG OPSITE POSTOP 3X4 (GAUZE/BANDAGES/DRESSINGS) ×5 IMPLANT
DRSG OPSITE POSTOP 4X6 (GAUZE/BANDAGES/DRESSINGS) ×1 IMPLANT
ELECT REM PT RETURN 9FT ADLT (ELECTROSURGICAL) ×2
ELECTRODE REM PT RTRN 9FT ADLT (ELECTROSURGICAL) ×1 IMPLANT
GAUZE XEROFORM 1X8 LF (GAUZE/BANDAGES/DRESSINGS) ×1 IMPLANT
GLOVE SRG 8 PF TXTR STRL LF DI (GLOVE) ×1 IMPLANT
GLOVE SURG SYN 7.5  E (GLOVE) ×1
GLOVE SURG SYN 7.5 E (GLOVE) ×1 IMPLANT
GLOVE SURG SYN 7.5 PF PI (GLOVE) ×1 IMPLANT
GLOVE SURG UNDER POLY LF SZ8 (GLOVE) ×1
GOWN STRL REUS W/ TWL LRG LVL3 (GOWN DISPOSABLE) ×1 IMPLANT
GOWN STRL REUS W/ TWL XL LVL3 (GOWN DISPOSABLE) ×1 IMPLANT
GOWN STRL REUS W/TWL LRG LVL3 (GOWN DISPOSABLE) ×1
GOWN STRL REUS W/TWL XL LVL3 (GOWN DISPOSABLE) ×1
GUIDE PIN 3.2X343 (PIN) ×2
GUIDE PIN 3.2X343MM (PIN) ×2
GUIDE ROD 3.0 (MISCELLANEOUS) ×2
KIT PATIENT CARE HANA TABLE (KITS) ×2 IMPLANT
KIT TURNOVER KIT A (KITS) ×2 IMPLANT
MANIFOLD NEPTUNE II (INSTRUMENTS) ×2 IMPLANT
MAT ABSORB  FLUID 56X50 GRAY (MISCELLANEOUS) ×1
MAT ABSORB FLUID 56X50 GRAY (MISCELLANEOUS) ×2 IMPLANT
NAIL TRIGEN RT 10MMX36CM-125 (Nail) ×1 IMPLANT
NDL FILTER BLUNT 18X1 1/2 (NEEDLE) ×1 IMPLANT
NEEDLE FILTER BLUNT 18X 1/2SAF (NEEDLE) ×1
NEEDLE FILTER BLUNT 18X1 1/2 (NEEDLE) ×1 IMPLANT
NEEDLE HYPO 22GX1.5 SAFETY (NEEDLE) ×2 IMPLANT
NS IRRIG 1000ML POUR BTL (IV SOLUTION) ×2 IMPLANT
PACK HIP COMPR (MISCELLANEOUS) ×2 IMPLANT
PENCIL ELECTRO HAND CTR (MISCELLANEOUS) ×2 IMPLANT
PIN GUIDE 3.2X343MM (PIN) IMPLANT
ROD GUIDE 3.0 (MISCELLANEOUS) IMPLANT
SCREW LAG COMPR KIT 95/90 (Screw) ×1 IMPLANT
SCREW TRIGEN LOW PROF 5.0X37.5 (Screw) ×1 IMPLANT
STAPLER SKIN PROX 35W (STAPLE) ×2 IMPLANT
SUT VIC AB 0 CT1 36 (SUTURE) ×1 IMPLANT
SUT VIC AB 2-0 CT2 27 (SUTURE) ×2 IMPLANT
SYR 10ML LL (SYRINGE) ×2 IMPLANT
SYR 30ML LL (SYRINGE) ×2 IMPLANT

## 2020-08-24 NOTE — Anesthesia Procedure Notes (Signed)
Spinal  Patient location during procedure: OR Start time: 08/24/2020 8:30 AM End time: 08/24/2020 8:46 AM Reason for block: surgical anesthesia Staffing Performed: anesthesiologist  Anesthesiologist: Arita Miss, MD Preanesthetic Checklist Completed: patient identified, IV checked, site marked, risks and benefits discussed, surgical consent, monitors and equipment checked, pre-op evaluation and timeout performed Spinal Block Patient position: right lateral decubitus Prep: ChloraPrep Patient monitoring: heart rate, continuous pulse ox, blood pressure and cardiac monitor Approach: midline Location: L3-4 Injection technique: single-shot Needle Needle type: Quincke  Needle gauge: 22 G Needle length: 9 cm Assessment Sensory level: T10 Events: CSF return Additional Notes Negative paresthesia. Negative blood return. Positive free-flowing CSF. Expiration date of kit checked and confirmed. Patient tolerated procedure well, without complications.

## 2020-08-24 NOTE — Evaluation (Signed)
Occupational Therapy Evaluation Patient Details Name: Susan Kim MRN: [id number] DOB: [birth year] Today's Date: 08/24/2020    History of Present Illness Per MD Notes: Susan Kim is a 70 y.o. female with a past medical history of fibromyalgia and hypertension who presents via EMS from home for assessment of right hip pain after patient states she slipped on a rug falling onto her right hip. Imaging revealed Right intertrochanteric hip fracture. Pt now s/p IM nailing. To be WBAT on RLE.   Clinical Impression   Ms. Susan Kim was seen for OT evaluation this date, POD#0 from above surgery. Pt was independent in all ADLs prior to her fall. She denies use of AE for functional mobility or ADL tasks, but does have some home equipment saved from her husband who passed away 4 years ago. Pt enjoys gardening and is currently driving. Pt is eager to return to PLOF with less pain and improved safety and independence. Pt currently requires MIN A for bed mobility, MOD assist for LB dressing and bathing while in seated position due to pain and limited AROM of R hip. Pt instructed in self care skills, falls prevention strategies, home/routines modifications, DME/AE for LB bathing and dressing tasks, and compression stocking mgt strategies. Pt would benefit from additional instruction in self care skills and techniques to help maintain precautions with or without assistive devices to support recall and carryover prior to discharge. Recommend STR upon hospital DC to maximize pt safety and return to PLOF.      Follow Up Recommendations  SNF;Supervision - Intermittent    Equipment Recommendations  3 in 1 bedside commode (If pt does not have her own at home.)    Recommendations for Other Services       Precautions / Restrictions Precautions Precautions: Fall Restrictions Weight Bearing Restrictions: Yes RLE Weight Bearing: Weight bearing as tolerated      Mobility Bed Mobility Overal bed mobility:  Needs Assistance Bed Mobility: Sidelying to Sit;Supine to Sit   Sidelying to sit: Min assist Supine to sit: Min assist     General bed mobility comments: MIN A for RLE mgt in/out of bed.    Transfers Overall transfer level: Needs assistance Equipment used: Rolling walker (2 wheeled) Transfers: Sit to/from Stand Sit to Stand: Min guard              Balance Overall balance assessment: Needs assistance Sitting-balance support: Feet supported Sitting balance-Leahy Scale: Good Sitting balance - Comments: Steady static sitting, reaching within BOS @ EOB.   Standing balance support: During functional activity;Bilateral upper extremity supported Standing balance-Leahy Scale: Fair Standing balance comment: Steady static standing with BUE supported.                           ADL either performed or assessed with clinical judgement   ADL Overall ADL's : Needs assistance/impaired                                       General ADL Comments: Pt is functionally limited by increased pain and decreased AROM of her RLE. She requires MIN A for bed mobility. Is able to perform STS with close CGA on POD#0. Significantly pain limited and unable to attempt further mobility. Anticipate MOD A for LB ADL management. MAX A for compression stocking management on RLE.     Vision Patient Visual Report: No change from  baseline       Perception     Praxis      Pertinent Vitals/Pain Pain Assessment: 0-10 Pain Score: 5  Pain Location: RLE Pain Descriptors / Indicators: Sore;Grimacing;Guarding Pain Intervention(s): Limited activity within patient's tolerance;Monitored during session;Repositioned     Hand Dominance Right   Extremity/Trunk Assessment Upper Extremity Assessment Upper Extremity Assessment: Overall WFL for tasks assessed   Lower Extremity Assessment Lower Extremity Assessment: RLE deficits/detail;Defer to PT evaluation RLE Deficits / Details: Pain  limited t/o session. s/p R IM nailing. WBAT RLE Sensation: WNL RLE Coordination: decreased gross motor   Cervical / Trunk Assessment Cervical / Trunk Assessment: Normal   Communication Communication Communication: No difficulties   Cognition Arousal/Alertness: Awake/alert Behavior During Therapy: WFL for tasks assessed/performed Overall Cognitive Status: Within Functional Limits for tasks assessed                                 General Comments: Pleasant, conversational, A&O t/o session.   General Comments       Exercises Other Exercises Other Exercises: Pt educated in falls prevention strategies, safe use of AE/DME for LB ADL management, complementary alternative methods for pain management including deep breathing and distraction techniques, and routines modifications to support safety and fxl independence during meaningful occupations of daily life.   Shoulder Instructions      Home Living Family/patient expects to be discharged to:: Private residence Living Arrangements: Alone   Type of Home: House Home Access: Stairs to enter [employer] of Steps: 4 at front   [place]: One level     Bathroom Shower/Tub: Tub/shower unit   [place]: Grab bars - tub/shower;Hand held Tourist information centre manager - 2 wheels   Additional Comments: Has friends who have offered to let her stay with them for recovery.      Prior Functioning/Environment Level of Independence: Independent        Comments: Pt reports she is independent in all ADL/IADL at baseline. Endorses driving, community ambulation, and enjoys gardening. Endorses one other fall in the last year where she tripped on a hose outside.        OT Problem List: Decreased strength;Decreased coordination;Pain;Decreased range of motion;Decreased activity tolerance;Decreased safety awareness;Impaired balance (sitting and/or standing);Decreased knowledge of use of DME or  AE;Decreased knowledge of precautions      OT Treatment/Interventions: Self-care/ADL training;Therapeutic exercise;Therapeutic activities;DME and/or AE instruction;Patient/family education;Balance training;Energy conservation    OT Goals(Current goals can be found in the care plan section) Acute Rehab OT Goals Patient Stated Goal: to have less pain. OT Goal Formulation: With patient Time For Goal Achievement: 09/07/20 Potential to Achieve Goals: Good ADL Goals Pt Will Perform Grooming: with modified independence;standing Pt Will Perform Lower Body Dressing: sit to/from stand;with modified independence;with adaptive equipment Pt Will Transfer to Toilet: bedside commode;with modified independence;ambulating Pt Will Perform Toileting - Clothing Manipulation and hygiene: sit to/from stand;with modified independence;with adaptive equipment  OT Frequency: Min 2X/week   Barriers to D/C: Decreased caregiver support;Inaccessible home environment  Pt lives alone with multiple steps to enter the home.       Co-evaluation              AM-PAC OT "6 Clicks" Daily Activity     Outcome Measure Help from another person eating meals?: None Help from another person taking care of personal grooming?: A Little Help from another person toileting, which includes  using toliet, bedpan, or urinal?: A Lot Help from another person bathing (including washing, rinsing, drying)?: A Lot Help from another person to put on and taking off regular upper body clothing?: A Little Help from another person to put on and taking off regular lower body clothing?: A Lot 6 Click Score: 16   End of Session Equipment Utilized During Treatment: Gait belt;Rolling walker Nurse Communication: Mobility status;Other (comment) (pt c/o pain with cathetheter. RN in during session to assess.)  Activity Tolerance: Patient tolerated treatment well Patient left: in bed;with call bell/phone within reach;with bed alarm set  OT Visit  Diagnosis: Other abnormalities of gait and mobility (R26.89);Pain Pain - Right/Left: Right Pain - part of body: Hip                Time: 1316-1406 OT Time Calculation (min): 50 min Charges:  OT General Charges $OT Visit: 1 Visit OT Evaluation $OT Eval Moderate Complexity: 1 Mod OT Treatments $Self Care/Home Management : 38-52 mins  Shara Blazing, M.S., OTR/L Ascom: [phone] 08/24/20, 3:46 PM

## 2020-08-24 NOTE — H&P (Signed)
Paper H&P to be scanned into permanent record. H&P reviewed. No significant changes noted.  

## 2020-08-24 NOTE — Progress Notes (Signed)
Initial Nutrition Assessment  DOCUMENTATION CODES:  Obesity unspecified  INTERVENTION:  Advance diet to regular after surgery Ensure Max po BID, each supplement provides 150 kcal and 30 grams of protein.  Continue vitamin d daily  NUTRITION DIAGNOSIS:  Increased nutrient needs related to hip fracture as evidenced by estimated needs.  GOAL:  Patient will meet greater than or equal to 90% of their needs  MONITOR:  PO intake, Supplement acceptance, Labs, Skin  REASON FOR ASSESSMENT:  Consult Hip fracture protocol  ASSESSMENT  Pt with a PMH of fibromyalgia and HTN presented to ED from home for assessment of right hip pain after slipping on a rug falling onto her right hip. Imaging in ED showed a right intertrochanteric hip fracture.   Pt underwent intramedullary nailing of right femur this AM, remains in PACU at this time. Unable to obtain physical exam or nutrition hx. Reviewed chart, no significant medical hx. Limited weight hx available. Due to increased needs post-operatively, would recommend ensure max to assist in meeting protein needs. Will follow-up with pt for hx and exam.  Nutritionally Relevant Medications: Scheduled Meds:  cholecalciferol  2,000 Units Oral Daily   famotidine  20 mg Oral Daily   hydrochlorothiazide  25 mg Oral Daily   pantoprazole  80 mg Oral Q1200   polyethylene glycol  17 g Oral Daily   senna-docusate  1 tablet Oral BID   vitamin B-12  500 mcg Oral Daily   Labs Reviewed: Na 132 K 3.4 SBG ranges from 118-123 mg/dL over the last 24 hours  NUTRITION - FOCUSED PHYSICAL EXAM: Defer to in-person assessment.  Diet Order:   Diet Order             Diet NPO time specified Except for: Sips with Meds  Diet effective midnight                  EDUCATION NEEDS:  No education needs have been identified at this time  Skin:  Skin Assessment: Reviewed RN Assessment  Last BM:  6/16  Height:  Ht Readings from Last 1 Encounters:  08/24/20 5\' 2"   (1.575 m)   Weight:  Wt Readings from Last 1 Encounters:  08/24/20 83.8 kg    Ideal Body Weight:  50 kg  BMI:  Body mass index is 33.79 kg/m.  Estimated Nutritional Needs:  Kcal:  1600-1800 kcal Protein:  80-95 g/d Fluid:  >1.8L/d   Ranell Patrick, RD, LDN Clinical Dietitian Pager on [place]

## 2020-08-24 NOTE — Anesthesia Preprocedure Evaluation (Signed)
Anesthesia Evaluation  Patient identified by MRN, date of birth, ID band Patient awake  General Assessment Comment:Femur fracture s/p fall at home. No anticoagulants.  Reviewed: Allergy & Precautions, NPO status , Patient's Chart, lab work & pertinent test results  History of Anesthesia Complications Negative for: history of anesthetic complications  Airway Mallampati: II  TM Distance: >3 FB Neck ROM: Full    Dental no notable dental hx. (+) Teeth Intact   Pulmonary neg pulmonary ROS, neg sleep apnea, neg COPD, Patient abstained from smoking.Not current smoker,    Pulmonary exam normal breath sounds clear to auscultation       Cardiovascular Exercise Tolerance: Good METShypertension, (-) CAD and (-) Past MI (-) dysrhythmias  Rhythm:Regular Rate:Normal - Systolic murmurs    Neuro/Psych  Neuromuscular disease negative psych ROS   GI/Hepatic GERD  Medicated,(+)     (-) substance abuse  , Patient with prior diagnosis of gastric outlet obstruction, s/p EGD and botox injection. Patient says she does not get symptoms of reflux or stomach contents coming back up into her mouth.   Endo/Other  neg diabetes  Renal/GU negative Renal ROS     Musculoskeletal  (+) Fibromyalgia -  Abdominal   Peds  Hematology   Anesthesia Other Findings Past Medical History: No date: Fibromyalgia No date: Hypertension  Reproductive/Obstetrics                             Anesthesia Physical Anesthesia Plan  ASA: 2  Anesthesia Plan: Spinal   Post-op Pain Management:    Induction: Intravenous  PONV Risk Score and Plan: 2 and Ondansetron, Dexamethasone, Propofol infusion, TIVA, Treatment may vary due to age or medical condition and Midazolam  Airway Management Planned: Natural Airway  Additional Equipment: None  Intra-op Plan:   Post-operative Plan:   Informed Consent: I have reviewed the patients History  and Physical, chart, labs and discussed the procedure including the risks, benefits and alternatives for the proposed anesthesia with the patient or authorized representative who has indicated his/her understanding and acceptance.       Plan Discussed with: CRNA and Surgeon  Anesthesia Plan Comments: (Discussed R/B/A of neuraxial anesthesia technique with patient: - rare risks of spinal/epidural hematoma, nerve damage, infection - Risk of PDPH - Risk of nausea and vomiting - Risk of conversion to general anesthesia and its associated risks, including sore throat, damage to lips/teeth/oropharynx, and rare risks such as cardiac and respiratory events.  Patient voiced understanding.)        Anesthesia Quick Evaluation

## 2020-08-24 NOTE — Evaluation (Signed)
Physical Therapy Evaluation Patient Details Name: Susan Kim MRN: [id number] DOB: Nov 17, 1950 Today's Date: 08/24/2020   History of Present Illness  Per MD Notes: Minola Guin is a 70 y.o. female with a past medical history of fibromyalgia and hypertension who presents via EMS from home for assessment of right hip pain after patient states she slipped on a rug falling onto her right hip. Imaging revealed Right intertrochanteric hip fracture. Pt now s/p IM nailing. To be WBAT on RLE.    Clinical Impression  Pt received in Semi-Fowler's position and agreeable to therapy.  Pt was able to perform bed level exercises with some difficulty on the R LE due to pain.  Pt has some disconnect with muscular control, however was able to increase muscle contractility as bouts of exercises progressed.  Pt then was able to perform bed mobility with verbal cuing for hand placement and extra time needed in order to keep at a minimum.  Pt then progressed to standing at EOB and was able to ambulate forward with decreased pain, however has difficulty with hip abduction and hip extension for returning back to bed.  Pt able to achieve 4 feet during ambulation and then transferred back to bed with all needs met.  Due to difficulty with home environment, recommendation is suggested for STR to increase strength and progress as needed.  Pt will benefit from skilled PT intervention to increase independence and safety with basic mobility in preparation for discharge to the venue listed below.        Follow Up Recommendations SNF    Equipment Recommendations  Rolling walker with 5" wheels;3in1 (PT)    Recommendations for Other Services       Precautions / Restrictions Precautions Precautions: Fall Restrictions Weight Bearing Restrictions: Yes RLE Weight Bearing: Weight bearing as tolerated      Mobility  Bed Mobility Overal bed mobility: Needs Assistance Bed Mobility: Sidelying to Sit;Supine to Sit    Sidelying to sit: Min assist Supine to sit: Min assist     General bed mobility comments: MIN A for RLE mgt in/out of bed.    Transfers Overall transfer level: Needs assistance Equipment used: Rolling walker (2 wheeled) Transfers: Sit to/from Stand Sit to Stand: Min guard            Ambulation/Gait Ambulation/Gait assistance: Min guard Gait Distance (Feet): 3 Feet Assistive device: Rolling walker (2 wheeled) Gait Pattern/deviations: Step-to pattern;Decreased step length - left;Decreased stance time - right;Decreased stride length Gait velocity: decreased   General Gait Details: Pt with decreased gait speed due to pain in the R LE with movement.  Pt has increased difficulty with extension and abduction specifically.  Stairs            Wheelchair Mobility    Modified Rankin (Stroke Patients Only)       Balance Overall balance assessment: Needs assistance Sitting-balance support: Feet supported Sitting balance-Leahy Scale: Good Sitting balance - Comments: Steady static sitting, reaching within BOS @ EOB.   Standing balance support: During functional activity;Bilateral upper extremity supported Standing balance-Leahy Scale: Fair Standing balance comment: Steady static standing with BUE supported.                             Pertinent Vitals/Pain Pain Assessment: 0-10 Pain Score: 4  Pain Location: RLE Pain Descriptors / Indicators: Sore;Grimacing;Guarding Pain Intervention(s): Limited activity within patient's tolerance;Monitored during session;Repositioned    Home Living Family/patient expects to be discharged  to:: Private residence Living Arrangements: Alone   Type of Home: House Home Access: Stairs to enter   [employer] of Steps: 4 at front [place]: One level Home Equipment: Grab bars - tub/shower;Hand held shower head;Walker - 2 wheels Additional Comments: Has friends who have offered to let her stay with them for  recovery.    Prior Function Level of Independence: Independent         Comments: Pt reports she is independent in all ADL/IADL at baseline. Endorses driving, community ambulation, and enjoys gardening. Endorses one other fall in the last year where she tripped on a hose outside.     Hand Dominance   Dominant Hand: Right    Extremity/Trunk Assessment   Upper Extremity Assessment Upper Extremity Assessment: Overall WFL for tasks assessed;Defer to OT evaluation    Lower Extremity Assessment Lower Extremity Assessment: RLE deficits/detail RLE Deficits / Details: Pain limited t/o session. s/p R IM nailing. WBAT RLE Sensation: WNL RLE Coordination: decreased gross motor    Cervical / Trunk Assessment Cervical / Trunk Assessment: Normal  Communication   Communication: No difficulties  Cognition Arousal/Alertness: Awake/alert Behavior During Therapy: WFL for tasks assessed/performed Overall Cognitive Status: Within Functional Limits for tasks assessed                                 General Comments: Pleasant, conversational, A&O t/o session.      General Comments      Exercises Total Joint Exercises Ankle Circles/Pumps: AROM;Strengthening;Both;10 reps;Supine Quad Sets: AROM;Strengthening;Both;10 reps;Supine Gluteal Sets: AROM;Strengthening;Both;10 reps;Supine Towel Squeeze: AROM;Strengthening;Both;10 reps;Supine Heel Slides: AROM;Left;AAROM;Right;Strengthening;10 reps;Supine Hip ABduction/ADduction: AROM;Left;AAROM;Right;Strengthening;10 reps;Supine Straight Leg Raises: AROM;Left;AAROM;Right;Strengthening;10 reps;Supine Other Exercises Other Exercises: Pt educated in falls prevention strategies, safe use of AE/DME for LB ADL management, complementary alternative methods for pain management including deep breathing and distraction techniques, and routines modifications to support safety and fxl independence during meaningful occupations of daily life. Other  Exercises: Pt educated on role of PT and services provided during hospital stay.  Pt also educated on the importance of exercise in order to prevent muscle atrophy during stay.   Assessment/Plan    PT Assessment Patient needs continued PT services  PT Problem List Decreased strength;Decreased range of motion;Decreased activity tolerance;Decreased balance;Decreased mobility;Pain       PT Treatment Interventions DME instruction;Gait training;Stair training;Functional mobility training;Therapeutic activities;Therapeutic exercise    PT Goals (Current goals can be found in the Care Plan section)  Acute Rehab PT Goals Patient Stated Goal: to decrease pain PT Goal Formulation: With patient Time For Goal Achievement: 09/07/20 Potential to Achieve Goals: Fair    Frequency BID   Barriers to discharge Inaccessible home environment Home has 5-6 steps to get into currently, but she endorses ability to live with someone who has 1 step.    Co-evaluation               AM-PAC PT "6 Clicks" Mobility  Outcome Measure Help needed turning from your back to your side while in a flat bed without using bedrails?: A Little Help needed moving from lying on your back to sitting on the side of a flat bed without using bedrails?: A Little Help needed moving to and from a bed to a chair (including a wheelchair)?: A Little Help needed standing up from a chair using your arms (e.g., wheelchair or bedside chair)?: A Little Help needed to walk in hospital room?: A Lot Help needed  climbing 3-5 steps with a railing? : Total 6 Click Score: 15    End of Session Equipment Utilized During Treatment: Gait belt Activity Tolerance: Patient tolerated treatment well;Patient limited by pain Patient left: in bed;with call bell/phone within reach;with bed alarm set;with family/visitor present Nurse Communication: Mobility status PT Visit Diagnosis: Unsteadiness on feet (R26.81);Other abnormalities of gait and  mobility (R26.89);Muscle weakness (generalized) (M62.81);History of falling (Z91.81);Pain;Difficulty in walking, not elsewhere classified (R26.2) Pain - Right/Left: Right Pain - part of body: Hip    Time: 1531-1610 PT Time Calculation (min) (ACUTE ONLY): 39 min   Charges:   PT Evaluation $PT Eval Low Complexity: 1 Low PT Treatments $Gait Training: 8-22 mins $Therapeutic Exercise: 8-22 mins        Gwenlyn Saran, PT, DPT 08/24/20, 4:35 PM   Christie Nottingham 08/24/2020, 4:31 PM

## 2020-08-24 NOTE — Op Note (Signed)
DATE OF SURGERY: 08/24/2020  PREOPERATIVE DIAGNOSIS: Right intertrochanteric hip fracture  POSTOPERATIVE DIAGNOSIS: Right intertrochanteric hip fracture  PROCEDURE: Intramedullary nailing of Right femur with cephalomedullary device  SURGEON: Cato Mulligan, MD  ANESTHESIA: spinal  EBL: 100 cc  IVF: per anesthesia record  COMPONENTS:  [employer] Tr[employer] Intertan Long Nail: 10x334mm; 46mm lag screw with 65mm compression screw; 5x 37.42mm distal cortical interlocking screw  INDICATIONS: Susan Kim is a 70 y.o. female who sustained an intertrochanteric fracture after a fall. Risks and benefits of intramedullary nailing were explained to the patient and/or family . Risks include but are not limited to bleeding, infection, injury to tissues, nerves, vessels, nonunion/malunion, hardware failure, limb length discrepancy/hip rotation mismatch and risks of anesthesia. The patient and/or family understand these risks, have completed an informed consent, and wish to proceed.   PROCEDURE:  The patient was brought into the operating room. After administering anesthesia, the patient was placed in the supine position on the Hana table. The uninjured leg was placed in an extended position while the injured lower extremity was placed in longitudinal traction. The fracture was reduced using longitudinal traction and internal rotation. The adequacy of reduction was verified fluoroscopically in AP and lateral projections and found to be acceptable. The lateral aspect of the hip and thigh were prepped with ChloraPrep solution before being draped sterilely. Preoperative IV antibiotics were administered. A timeout was performed to verify the appropriate surgical site, patient, and procedure.    The greater trochanter was identified and an approximately 7 cm incision was made about 3 fingerbreadths above the tip of the greater trochanter. The incision was carried down through the subcutaneous tissues to expose  the gluteal fascia. This was split the length of the incision, providing access to the tip of the trochanter. Under fluoroscopic guidance, a guidewire was drilled through the tip of the trochanter into the proximal metaphysis to the level of the lesser trochanter. After verifying its position fluoroscopically in AP and lateral projections, it was overreamed with the opening reamer to the level of the lesser trochanter. A guidewire was passed down through the femoral canal to the supracondylar region. The guidewire was overreamed sequentially using the flexible reamers. The nail was selected and advanced to the appropriate depth as verified fluoroscopically.    The guide system for the lag screw was positioned and advanced through an approximately 4cm incision over the lateral aspect of the proximal femur. The guidewire was drilled up through the femoral nail and into the femoral neck to rest within 5 mm of subchondral bone. After verifying its position in the femoral neck and head in both AP and lateral projections, the guidewire was measured and appropriate sized lag screw was selected.  The channel for the compression screw was drilled and antirotation bar was placed.  Lag screw was drilled and placed in appropriate position.  Compression screw was then placed.  Appropriate compression was achieved.  The set screw was locked in place. Again, the adequacy of hardware position and fracture reduction was verified fluoroscopically in AP and lateral projections.   Attention was directed distally. Using the "perfect circle" technique, the leg and fluoroscopy machine were positioned appropriately. A 2cm stab incision was made over the skin and IT band at the appropriate point before the drill bit was advanced through the cortex and across the static hole of the nail. Appropriate screw length was determined with a measuring guide. A distal interlocking screw was placed. Again, the adequacy of screw position  was  verified fluoroscopically in AP and lateral projections.   The wounds were irrigated thoroughly with sterile saline solution. Local anesthetic was injected into the wounds. Deep fascia was closed with 0-Vicryl. The subcutaneous tissues were closed using 2-0 Vicryl interrupted sutures. The skin was closed using staples. Sterile occlusive dressings were applied to all wounds. The patient was then transferred to the recovery room in satisfactory condition.   POSTOPERATIVE PLAN: The patient will be WBAT on the operative extremity. Lovenox 40mg /day x 4 weeks to start on POD#1. Perioperative IV antibiotics x 24 hours. PT/OT on POD#0-1.

## 2020-08-24 NOTE — Progress Notes (Signed)
PROGRESS NOTE    Susan Kim  ZOX:[id number] DOB: 12/01/1950 DOA: 08/23/2020 PCP: Conni Elliot, MD    Assessment & Plan:   Active Problems:   Closed right hip fracture, initial encounter [facility])   Essential hypertension   Insomnia  Right intertrochanteric fracture: secondary to mechanical fall at home. Oxycodone, morphine prn for pain. S/p intramedullary nailing of right femur   Depression: severity unknown. Continue on home dose of bupropion, duloxetine  HTN: continue on home dose of HCTZ  GERD: continue on PPI and H2 blocker  Chronic constipation: continue on senna docusate  Insomnia: continue on home dose of melatonin    DVT prophylaxis: lovenox  Code Status: full  Family Communication: discussed pt's care w/ pt's family at bedside and answered their questions  Disposition Plan: depends on PT/OT recs   Level of care: Med-Surg  Status is: Inpatient  Remains inpatient appropriate because:Unsafe d/c plan, IV treatments appropriate due to intensity of illness or inability to take PO, and Inpatient level of care appropriate due to severity of illness  Dispo: The patient is from: Home              Anticipated d/c is to: SNF vs [facility]              Patient currently is not medically stable to d/c.   Difficult to place patient : unclear    Consultants:  Ortho surg   Procedures:   Antimicrobials:   Subjective: Pt c/o right hip pain   Objective: Vitals:   08/23/20 2040 08/24/20 0035 08/24/20 0510 08/24/20 0736  BP: (!) 128/57 133/68 135/60 (!) 135/55  Pulse: 91 91 86 92  Resp: 18 16 18 14   Temp: 98.1 F (36.7 C) 98.4 F (36.9 C) 98.3 F (36.8 C) 98.2 F (36.8 C)  TempSrc: Oral Oral Oral Oral  SpO2: 100% 100% 100% 100%  Weight:   83.8 kg 83.8 kg  Height:   5\' 2"  (1.575 m) 5\' 2"  (1.575 m)    Intake/Output Summary (Last 24 hours) at 08/24/2020 0752 Last data filed at 08/24/2020 0510 Gross per 24 hour  Intake --  Output 450 ml  Net -450 ml    Filed Weights   08/23/20 1152 08/24/20 0510 08/24/20 0736  Weight: 79.4 kg 83.8 kg 83.8 kg    Examination:  General exam: Appears calm and comfortable  Respiratory system: Clear to auscultation. Respiratory effort normal. Cardiovascular system: S1 & S2 heard, RRR. No JVD, murmurs, rubs, gallops or clicks. No pedal edema. Gastrointestinal system: Abdomen is nondistended, soft and nontender. No organomegaly or masses felt. Normal bowel sounds heard. Central nervous system: Alert and oriented. No focal neurological deficits. Extremities: Symmetric 5 x 5 power. Skin: No rashes, lesions or ulcers Psychiatry: Judgement and insight appear normal. Mood & affect appropriate.     Data Reviewed: I have personally reviewed following labs and imaging studies  CBC: Recent Labs  Lab 08/23/20 1156 08/24/20 0419  WBC 5.4 5.9  NEUTROABS 4.1  --   HGB 12.7 11.3*  HCT 37.3 33.4*  MCV 96.1 96.0  PLT 235 811   Basic Metabolic Panel: Recent Labs  Lab 08/23/20 1156 08/24/20 0419  NA 137 132*  K 3.5 3.4*  CL 103 99  CO2 26 28  GLUCOSE 118* 123*  BUN 19 21  CREATININE 0.69 0.72  CALCIUM 8.7* 8.4*   GFR: Estimated Creatinine Clearance: 63.8 mL/min (by C-G formula based on SCr of 0.72 mg/dL). Liver Function Tests: No results for  input(s): AST, ALT, ALKPHOS, BILITOT, PROT, ALBUMIN in the last 168 hours. No results for input(s): LIPASE, AMYLASE in the last 168 hours. No results for input(s): AMMONIA in the last 168 hours. Coagulation Profile: Recent Labs  Lab 08/23/20 1156  INR 1.0   Cardiac Enzymes: No results for input(s): CKTOTAL, CKMB, CKMBINDEX, TROPONINI in the last 168 hours. BNP (last 3 results) No results for input(s): PROBNP in the last 8760 hours. HbA1C: No results for input(s): HGBA1C in the last 72 hours. CBG: No results for input(s): GLUCAP in the last 168 hours. Lipid Profile: No results for input(s): CHOL, HDL, LDLCALC, TRIG, CHOLHDL, LDLDIRECT in the last 72  hours. Thyroid Function Tests: No results for input(s): TSH, T4TOTAL, FREET4, T3FREE, THYROIDAB in the last 72 hours. Anemia Panel: No results for input(s): VITAMINB12, FOLATE, FERRITIN, TIBC, IRON, RETICCTPCT in the last 72 hours. Sepsis Labs: No results for input(s): PROCALCITON, LATICACIDVEN in the last 168 hours.  Recent Results (from the past 240 hour(s))  Resp Panel by RT-PCR (Flu A&B, Covid) Nasopharyngeal Swab     Status: None   Collection Time: 08/23/20 11:56 AM   Specimen: Nasopharyngeal Swab; Nasopharyngeal(NP) swabs in vial transport medium  Result Value Ref Range Status   SARS Coronavirus 2 by RT PCR NEGATIVE NEGATIVE Final    Comment: (NOTE) SARS-CoV-2 target nucleic acids are NOT DETECTED.  The SARS-CoV-2 RNA is generally detectable in upper respiratory specimens during the acute phase of infection. The lowest concentration of SARS-CoV-2 viral copies this assay can detect is 138 copies/mL. A negative result does not preclude SARS-Cov-2 infection and should not be used as the sole basis for treatment or other patient management decisions. A negative result may occur with  improper specimen collection/handling, submission of specimen other than nasopharyngeal swab, presence of viral mutation(s) within the areas targeted by this assay, and inadequate number of viral copies(<138 copies/mL). A negative result must be combined with clinical observations, patient history, and epidemiological information. The expected result is Negative.  Fact Sheet for Patients:  EntrepreneurPulse.com.au  Fact Sheet for Healthcare Providers:  IncredibleEmployment.be  This test is no t yet approved or cleared by the [place] FDA and  has been authorized for detection and/or diagnosis of SARS-CoV-2 by FDA under an Emergency Use Authorization (EUA). This EUA will remain  in effect (meaning this test can be used) for the duration of the COVID-19  declaration under Section 564(b)(1) of the Act, 21 U.S.C.section 360bbb-3(b)(1), unless the authorization is terminated  or revoked sooner.       Influenza A by PCR NEGATIVE NEGATIVE Final   Influenza B by PCR NEGATIVE NEGATIVE Final    Comment: (NOTE) The Xpert Xpress SARS-CoV-2/FLU/RSV plus assay is intended as an aid in the diagnosis of influenza from Nasopharyngeal swab specimens and should not be used as a sole basis for treatment. Nasal washings and aspirates are unacceptable for Xpert Xpress SARS-CoV-2/FLU/RSV testing.  Fact Sheet for Patients: EntrepreneurPulse.com.au  Fact Sheet for Healthcare Providers: IncredibleEmployment.be  This test is not yet approved or cleared by the [place] FDA and has been authorized for detection and/or diagnosis of SARS-CoV-2 by FDA under an Emergency Use Authorization (EUA). This EUA will remain in effect (meaning this test can be used) for the duration of the COVID-19 declaration under Section 564(b)(1) of the Act, 21 U.S.C. section 360bbb-3(b)(1), unless the authorization is terminated or revoked.  Performed at [facility], [address] [place], [place] [address]   Surgical pcr screen  Status: None   Collection Time: 08/23/20  6:44 PM   Specimen: Nasal Mucosa; Nasal Swab  Result Value Ref Range Status   MRSA, PCR NEGATIVE NEGATIVE Final   Staphylococcus aureus NEGATIVE NEGATIVE Final    Comment: (NOTE) The Xpert SA Assay (FDA approved for NASAL specimens in patients 87 years of age and older), is one component of a comprehensive surveillance program. It is not intended to diagnose infection nor to guide or monitor treatment. Performed at [facility], [address] [place], [place] [address]          Radiology Studies: DG Chest 1 View  Result Date: 08/23/2020 CLINICAL DATA:  RIGHT hip fracture, fell today EXAM: CHEST  1 VIEW COMPARISON:  None FINDINGS:  Upper normal heart size. Mediastinal contours and pulmonary vascularity normal. Calcified granulomata in both lungs with calcified lymph nodes at LEFT hilum. Lungs otherwise clear. No acute infiltrate, pleural effusion, or pneumothorax. Bones appear demineralized. IMPRESSION: Old granulomatous disease. No acute abnormalities. Electronically Signed   By: Lavonia Dana M.D.   On: 08/23/2020 14:08   DG Hip Unilat W or Wo Pelvis 2-3 Views Right  Result Date: 08/23/2020 CLINICAL DATA:  Right hip pain after a fall today. EXAM: DG HIP (WITH OR WITHOUT PELVIS) 2-3V RIGHT COMPARISON:  None. FINDINGS: The patient has an acute right intertrochanteric fracture. The fracture is mildly comminuted with the lesser trochanter a separate fragment. No other acute abnormality. IMPRESSION: Acute right intertrochanteric fracture. Electronically Signed   By: Inge Rise M.D.   On: 08/23/2020 14:07        Scheduled Meds:  [MAR Hold] buPROPion  300 mg Oral Daily   [MAR Hold] cholecalciferol  2,000 Units Oral Daily   [MAR Hold] DULoxetine  60 mg Oral QHS   [MAR Hold] famotidine  20 mg Oral Daily   [MAR Hold] hydrochlorothiazide  25 mg Oral Daily   [MAR Hold] melatonin  5 mg Oral QHS   [MAR Hold] pantoprazole  80 mg Oral Q1200   [MAR Hold] polyethylene glycol  17 g Oral Daily   [MAR Hold] senna-docusate  1 tablet Oral BID   [MAR Hold] vitamin B-12  500 mcg Oral Daily   Continuous Infusions:  ceFAZolin     [MAR Hold]  ceFAZolin (ANCEF) IV     [MAR Hold] tranexamic acid       LOS: 1 day    Time spent:33 mins     Wyvonnia Dusky, MD [facility] Pager [phone]  If 7PM-7AM, please contact night-coverage 08/24/2020, 7:52 AM

## 2020-08-24 NOTE — Progress Notes (Signed)
PT Cancellation Note  Patient Details Name: Susan Kim MRN: [id number] DOB: 1950-09-06   Cancelled Treatment:    Reason Eval/Treat Not Completed: Pain limiting ability to participate.  PT chart reviewed and attempted to see pt.  Per OT and pt, she is fatigued and is requesting pain meds from nursing.  Pt will be attempted to be seen at later date/time as medically appropriate.      Gwenlyn Saran, PT, DPT 08/24/20, 2:32 PM

## 2020-08-24 NOTE — Transfer of Care (Signed)
Immediate Anesthesia Transfer of Care Note  Patient: Susan Kim  Procedure(s) Performed: INTRAMEDULLARY (IM) NAIL INTERTROCHANTRIC (Right)  Patient Location: PACU  Anesthesia Type:Spinal  Level of Consciousness: awake, oriented, drowsy, patient cooperative and responds to stimulation  Airway & Oxygen Therapy: Patient Spontanous Breathing and Patient connected to face mask oxygen  Post-op Assessment: Report given to RN and Post -op Vital signs reviewed and unstable, Anesthesiologist notified  Post vital signs: Reviewed and unstable  Last Vitals:  Vitals Value Taken Time  BP 88/46 08/24/20 1013  Temp 36.4 C 08/24/20 1010  Pulse 75 08/24/20 1010  Resp 15 08/24/20 1014  SpO2 100 % 08/24/20 1010  Vitals shown include unvalidated device data.  Last Pain:  Vitals:   08/24/20 1010  TempSrc:   PainSc: 0-No pain         Complications: No notable events documented.

## 2020-08-25 DIAGNOSIS — F3289 Other specified depressive episodes: Secondary | ICD-10-CM

## 2020-08-25 LAB — BASIC METABOLIC PANEL
Anion gap: 4 — ABNORMAL LOW (ref 5–15)
BUN: 22 mg/dL (ref 8–23)
CO2: 28 mmol/L (ref 22–32)
Calcium: 8.7 mg/dL — ABNORMAL LOW (ref 8.9–10.3)
Chloride: 106 mmol/L (ref 98–111)
Creatinine, Ser: 0.7 mg/dL (ref 0.44–1.00)
GFR, Estimated: >60 mL/min (ref >60)
Glucose, Bld: 122 mg/dL — ABNORMAL HIGH (ref 70–99)
Potassium: 3.8 mmol/L (ref 3.5–5.1)
Sodium: 138 mmol/L (ref 135–145)

## 2020-08-25 LAB — CBC
HCT: 26.7 % — ABNORMAL LOW (ref 36.0–46.0)
Hemoglobin: 9 g/dL — ABNORMAL LOW (ref 12.0–15.0)
MCH: 32.5 pg (ref 26.0–34.0)
MCHC: 33.7 g/dL (ref 30.0–36.0)
MCV: 96.4 fL (ref 80.0–100.0)
Platelets: 182 10*3/uL (ref 150–400)
RBC: 2.77 MIL/uL — ABNORMAL LOW (ref 3.87–5.11)
RDW: 11.8 % (ref 11.5–15.5)
WBC: 5.7 10*3/uL (ref 4.0–10.5)
nRBC: 0 % (ref 0.0–0.2)

## 2020-08-25 MED ORDER — DOCUSATE SODIUM 100 MG PO CAPS
200.0000 mg | ORAL_CAPSULE | Freq: Two times a day (BID) | ORAL | Status: DC
  Administered 2020-08-25 – 2020-08-31 (×13): 200 mg via ORAL
  Filled 2020-08-25 (×13): qty 2

## 2020-08-25 MED ORDER — POLYETHYLENE GLYCOL 3350 17 G PO PACK
17.0000 g | PACK | Freq: Every day | ORAL | Status: DC
  Administered 2020-08-25 – 2020-08-29 (×5): 17 g via ORAL
  Filled 2020-08-25 (×5): qty 1

## 2020-08-25 NOTE — Progress Notes (Signed)
Physical Therapy Treatment Patient Details Name: Susan Kim MRN: [id number] DOB: November 06, 1950 Today's Date: 08/25/2020    History of Present Illness Per MD Notes: Susan Kim is a 70 y.o. female with a past medical history of fibromyalgia and hypertension who presents via EMS from home for assessment of right hip pain after patient states she slipped on a rug falling onto her right hip. Imaging revealed Right intertrochanteric hip fracture. Pt now s/p IM nailing. To be WBAT on RLE.    PT Comments    Pt received supine in bed talking with surgeon. Pt agreeable to PT this morning and eager to get to the chair. Completed therapeutic exercises including: ankle pumps x10, Quad sets with 5 second holds x10, heel slides x10, supine hip ABD/ADD, and LAQ for 10 repetitions with Vcs provided to pt for pacing and form. Patient required minA to complete supine <> sit EOB for RLE management 2/2 to pain and decreased RLE strength. Pt able to sit EOB with BIL LE supported completing various reaching activities safely.  Completed sit<>stand transfer using RW and minA for appropriate hand placement. Attempted ambulation EOB with pt demonstrating compensation in the trunk to clear RLE while ambulating 2/2 impaired strength in RLE and inability to activate hip flexors for appropriate gait. Pt completed transfer to recliner with Vcs from therapist for hand placement. Pt left seated in the recliner with bed alarm set, all needs met and call bell within reach.    Follow Up Recommendations  SNF     Equipment Recommendations  Rolling walker with 5" wheels;3in1 (PT)    Recommendations for Other Services       Precautions / Restrictions Precautions Precautions: Fall Restrictions Weight Bearing Restrictions: Yes RLE Weight Bearing: Weight bearing as tolerated    Mobility  Bed Mobility Overal bed mobility: Needs Assistance Bed Mobility: Sit to Supine       Sit to supine: Min assist   General bed  mobility comments: MIN A for RLE mgt into the bed, blocking of the LE for all sit<>stand transfers    Transfers Overall transfer level: Needs assistance Equipment used: Rolling walker (2 wheeled) Transfers: Sit to/from Stand Sit to Stand: Min assist         General transfer comment: Pt with increased pain during transfers, she required VCs for appropriate hand placement on the RW to decrease her risk for falls  Ambulation/Gait Ambulation/Gait assistance: Min assist Gait Distance (Feet): 4 Feet (Pt only able to ambulate EOB using RW with minA for RW management. She has decreased ability to appropriately acitvate LLE to ambulate compensating by using her trunk) Assistive device: Rolling walker (2 wheeled) Gait Pattern/deviations: Step-to pattern;Decreased step length - left;Decreased stance time - right;Decreased stride length Gait velocity: decreased   General Gait Details: Pt with antalgic gait, limited to 4 steps EOB 2/2 pain in RLE   Stairs             Wheelchair Mobility    Modified Rankin (Stroke Patients Only)       Balance Overall balance assessment: Needs assistance Sitting-balance support: Feet supported Sitting balance-Leahy Scale: Good Sitting balance - Comments: Steady static sitting while completing reaching activities is safe   Standing balance support: During functional activity;Bilateral upper extremity supported Standing balance-Leahy Scale: Fair Standing balance comment: Decreased stability in the standing position 2/2 pain and decreased R LE strength  Cognition Arousal/Alertness: Awake/alert Behavior During Therapy: WFL for tasks assessed/performed Overall Cognitive Status: Within Functional Limits for tasks assessed                                 General Comments: Pleasant, conversational, A&O t/o session      Exercises Total Joint Exercises Ankle Circles/Pumps:  AROM;Strengthening;Both;10 reps;Supine Quad Sets: AROM;Strengthening;Both;10 reps;Supine Heel Slides: AROM;Left;AAROM;Right;Strengthening;10 reps;Supine Hip ABduction/ADduction: AROM;Left;AAROM;Right;Strengthening;10 reps;Supine    General Comments        Pertinent Vitals/Pain Pain Assessment: Faces Faces Pain Scale: Hurts whole lot Pain Location: RLE Pain Descriptors / Indicators: Sore;Grimacing;Guarding Pain Intervention(s): Limited activity within patient's tolerance;Monitored during session;Repositioned    Home Living                      Prior Function            PT Goals (current goals can now be found in the care plan section) Acute Rehab PT Goals Patient Stated Goal: to decrease pain PT Goal Formulation: With patient Time For Goal Achievement: 09/07/20 Potential to Achieve Goals: Fair Progress towards PT goals: Progressing toward goals    Frequency    BID      PT Plan Current plan remains appropriate    Co-evaluation              AM-PAC PT "6 Clicks" Mobility   Outcome Measure  Help needed turning from your back to your side while in a flat bed without using bedrails?: A Little Help needed moving from lying on your back to sitting on the side of a flat bed without using bedrails?: A Little   Help needed standing up from a chair using your arms (e.g., wheelchair or bedside chair)?: A Little Help needed to walk in hospital room?: A Lot Help needed climbing 3-5 steps with a railing? : Total 6 Click Score: 12    End of Session Equipment Utilized During Treatment: Gait belt Activity Tolerance: Patient tolerated treatment well;Patient limited by pain Patient left: with call bell/phone within reach;with bed alarm set;in chair Nurse Communication: Mobility status PT Visit Diagnosis: Unsteadiness on feet (R26.81);Other abnormalities of gait and mobility (R26.89);Muscle weakness (generalized) (M62.81);History of falling (Z91.81);Pain;Difficulty  in walking, not elsewhere classified (R26.2) Pain - Right/Left: Right Pain - part of body: Hip     Time: 3612-2449 PT Time Calculation (min) (ACUTE ONLY): 34 min  Charges:  $Therapeutic Exercise: 8-22 mins $Therapeutic Activity: 8-22 mins                     Susan Kim, PT, DPT 08/25/20, 4:01 PM    Susan Kim 08/25/2020, 3:55 PM

## 2020-08-25 NOTE — Progress Notes (Signed)
PROGRESS NOTE    Susan Kim  ENI:[id number] DOB: 11/11/1950 DOA: 08/23/2020 PCP: Conni Elliot, MD    Assessment & Plan:   Active Problems:   Closed right hip fracture, initial encounter [facility])   Essential hypertension   Insomnia  Right intertrochanteric fracture: secondary to mechanical fall at home. Oxycodone, morphine prn for pain. S/p intramedullary nailing of right femur. Weight bearing as tolerated to RLE as per ortho surg   Depression: severity unknown. Continue on home dose of duloxetine, bupropion  HTN: continue on home dose of HCTZ  GERD: continue on famotidine, pantoprazole   Chronic constipation: continue on senna docusate  Insomnia: continue on home dose of melatonin     DVT prophylaxis: lovenox  Code Status: full  Family Communication:  Disposition Plan: likely d/c to SNF  Level of care: Med-Surg  Status is: Inpatient  Remains inpatient appropriate because:Unsafe d/c plan, IV treatments appropriate due to intensity of illness or inability to take PO, and Inpatient level of care appropriate due to severity of illness  Dispo: The patient is from: Home              Anticipated d/c is to: SNF              Patient currently is not medically stable to d/c.   Difficult to place patient : unclear    Consultants:  Ortho surg   Procedures:   Antimicrobials:   Subjective: Pt c/o right hip pain   Objective: Vitals:   08/24/20 1130 08/24/20 1524 08/24/20 1925 08/24/20 2350  BP: (!) 113/52 (!) 119/55 (!) 113/49 (!) 118/51  Pulse: 82 91 93 92  Resp: 15 15 16 16   Temp: 97.8 F (36.6 C) 98.5 F (36.9 C) 98.2 F (36.8 C) 98.4 F (36.9 C)  TempSrc:  Oral Oral Oral  SpO2: 99% 99% 99% 100%  Weight:      Height:        Intake/Output Summary (Last 24 hours) at 08/25/2020 0739 Last data filed at 08/24/2020 1834 Gross per 24 hour  Intake 1560 ml  Output 400 ml  Net 1160 ml   Filed Weights   08/23/20 1152 08/24/20 0510 08/24/20 0736   Weight: 79.4 kg 83.8 kg 83.8 kg    Examination:  General exam: Appears comfortable  Respiratory system: clear breath sounds b/l  Cardiovascular system: S1/S2+. No rubs, gallops or clicks.  Gastrointestinal system: Abdomen is nondistended, soft and nontender. Normal bowel sounds heard. Central nervous system: Alert and oriented. Moves all extremities  Psychiatry: judgement and insight appear normal. Flat mood and affect    Data Reviewed: I have personally reviewed following labs and imaging studies  CBC: Recent Labs  Lab 08/23/20 1156 08/24/20 0419 08/25/20 0444  WBC 5.4 5.9 5.7  NEUTROABS 4.1  --   --   HGB 12.7 11.3* 9.0*  HCT 37.3 33.4* 26.7*  MCV 96.1 96.0 96.4  PLT 235 215 614   Basic Metabolic Panel: Recent Labs  Lab 08/23/20 1156 08/24/20 0419 08/25/20 0444  NA 137 132* 138  K 3.5 3.4* 3.8  CL 103 99 106  CO2 26 28 28   GLUCOSE 118* 123* 122*  BUN 19 21 22   CREATININE 0.69 0.72 0.70  CALCIUM 8.7* 8.4* 8.7*   GFR: Estimated Creatinine Clearance: 63.8 mL/min (by C-G formula based on SCr of 0.7 mg/dL). Liver Function Tests: No results for input(s): AST, ALT, ALKPHOS, BILITOT, PROT, ALBUMIN in the last 168 hours. No results for input(s): LIPASE, AMYLASE in  the last 168 hours. No results for input(s): AMMONIA in the last 168 hours. Coagulation Profile: Recent Labs  Lab 08/23/20 1156  INR 1.0   Cardiac Enzymes: No results for input(s): CKTOTAL, CKMB, CKMBINDEX, TROPONINI in the last 168 hours. BNP (last 3 results) No results for input(s): PROBNP in the last 8760 hours. HbA1C: No results for input(s): HGBA1C in the last 72 hours. CBG: No results for input(s): GLUCAP in the last 168 hours. Lipid Profile: No results for input(s): CHOL, HDL, LDLCALC, TRIG, CHOLHDL, LDLDIRECT in the last 72 hours. Thyroid Function Tests: No results for input(s): TSH, T4TOTAL, FREET4, T3FREE, THYROIDAB in the last 72 hours. Anemia Panel: No results for input(s):  VITAMINB12, FOLATE, FERRITIN, TIBC, IRON, RETICCTPCT in the last 72 hours. Sepsis Labs: No results for input(s): PROCALCITON, LATICACIDVEN in the last 168 hours.  Recent Results (from the past 240 hour(s))  Resp Panel by RT-PCR (Flu A&B, Covid) Nasopharyngeal Swab     Status: None   Collection Time: 08/23/20 11:56 AM   Specimen: Nasopharyngeal Swab; Nasopharyngeal(NP) swabs in vial transport medium  Result Value Ref Range Status   SARS Coronavirus 2 by RT PCR NEGATIVE NEGATIVE Final    Comment: (NOTE) SARS-CoV-2 target nucleic acids are NOT DETECTED.  The SARS-CoV-2 RNA is generally detectable in upper respiratory specimens during the acute phase of infection. The lowest concentration of SARS-CoV-2 viral copies this assay can detect is 138 copies/mL. A negative result does not preclude SARS-Cov-2 infection and should not be used as the sole basis for treatment or other patient management decisions. A negative result may occur with  improper specimen collection/handling, submission of specimen other than nasopharyngeal swab, presence of viral mutation(s) within the areas targeted by this assay, and inadequate number of viral copies(<138 copies/mL). A negative result must be combined with clinical observations, patient history, and epidemiological information. The expected result is Negative.  Fact Sheet for Patients:  EntrepreneurPulse.com.au  Fact Sheet for Healthcare Providers:  IncredibleEmployment.be  This test is no t yet approved or cleared by the [place] FDA and  has been authorized for detection and/or diagnosis of SARS-CoV-2 by FDA under an Emergency Use Authorization (EUA). This EUA will remain  in effect (meaning this test can be used) for the duration of the COVID-19 declaration under Section 564(b)(1) of the Act, 21 U.S.C.section 360bbb-3(b)(1), unless the authorization is terminated  or revoked sooner.       Influenza A  by PCR NEGATIVE NEGATIVE Final   Influenza B by PCR NEGATIVE NEGATIVE Final    Comment: (NOTE) The Xpert Xpress SARS-CoV-2/FLU/RSV plus assay is intended as an aid in the diagnosis of influenza from Nasopharyngeal swab specimens and should not be used as a sole basis for treatment. Nasal washings and aspirates are unacceptable for Xpert Xpress SARS-CoV-2/FLU/RSV testing.  Fact Sheet for Patients: EntrepreneurPulse.com.au  Fact Sheet for Healthcare Providers: IncredibleEmployment.be  This test is not yet approved or cleared by the [place] FDA and has been authorized for detection and/or diagnosis of SARS-CoV-2 by FDA under an Emergency Use Authorization (EUA). This EUA will remain in effect (meaning this test can be used) for the duration of the COVID-19 declaration under Section 564(b)(1) of the Act, 21 U.S.C. section 360bbb-3(b)(1), unless the authorization is terminated or revoked.  Performed at [facility], [address], [place], [place] [address]   Surgical pcr screen     Status: None   Collection Time: 08/23/20  6:44 PM   Specimen: Nasal Mucosa; Nasal Swab  Result Value Ref Range Status   MRSA, PCR NEGATIVE NEGATIVE Final   Staphylococcus aureus NEGATIVE NEGATIVE Final    Comment: (NOTE) The Xpert SA Assay (FDA approved for NASAL specimens in patients 35 years of age and older), is one component of a comprehensive surveillance program. It is not intended to diagnose infection nor to guide or monitor treatment. Performed at [facility], [address] [place], [place] [address]          Radiology Studies: DG Chest 1 View  Result Date: 08/23/2020 CLINICAL DATA:  RIGHT hip fracture, fell today EXAM: CHEST  1 VIEW COMPARISON:  None FINDINGS: Upper normal heart size. Mediastinal contours and pulmonary vascularity normal. Calcified granulomata in both lungs with calcified lymph nodes at LEFT hilum. Lungs  otherwise clear. No acute infiltrate, pleural effusion, or pneumothorax. Bones appear demineralized. IMPRESSION: Old granulomatous disease. No acute abnormalities. Electronically Signed   By: Lavonia Dana M.D.   On: 08/23/2020 14:08   DG HIP OPERATIVE UNILAT W OR W/O PELVIS RIGHT  Result Date: 08/24/2020 CLINICAL DATA:  Intramedullary nail fixation of right femoral neck EXAM: OPERATIVE RIGHT HIP (WITH PELVIS IF PERFORMED) 2 VIEWS TECHNIQUE: Fluoroscopic spot image(s) were submitted for interpretation post-operatively. FLUOROSCOPY TIME:  2:09 COMPARISON:  Hip radiographs, 08/23/2020 FINDINGS: Intraoperative fluoroscopic images demonstrate intramedullary nail fixation of intratrochanteric fractures of the right hip. IMPRESSION: Intraoperative fluoroscopic images demonstrate intramedullary nail fixation of intratrochanteric fractures of the right hip. Electronically Signed   By: Eddie Candle M.D.   On: 08/24/2020 11:09   DG Hip Unilat W or Wo Pelvis 2-3 Views Right  Result Date: 08/23/2020 CLINICAL DATA:  Right hip pain after a fall today. EXAM: DG HIP (WITH OR WITHOUT PELVIS) 2-3V RIGHT COMPARISON:  None. FINDINGS: The patient has an acute right intertrochanteric fracture. The fracture is mildly comminuted with the lesser trochanter a separate fragment. No other acute abnormality. IMPRESSION: Acute right intertrochanteric fracture. Electronically Signed   By: Inge Rise M.D.   On: 08/23/2020 14:07        Scheduled Meds:  acetaminophen  1,000 mg Oral Q8H   buPROPion  300 mg Oral Daily   Chlorhexidine Gluconate Cloth  6 each Topical Daily   docusate sodium  100 mg Oral BID   DULoxetine  60 mg Oral Daily   enoxaparin (LOVENOX) injection  40 mg Subcutaneous Q24H   famotidine  20 mg Oral Daily   hydrochlorothiazide  25 mg Oral Daily   melatonin  5 mg Oral QHS   pantoprazole  80 mg Oral Q1200   Ensure Max Protein  11 oz Oral BID PC   simethicone  80 mg Oral BID   Continuous Infusions:   sodium chloride Stopped (08/25/20 0233)   methocarbamol (ROBAXIN) IV       LOS: 2 days    Time spent: 30 mins     Wyvonnia Dusky, MD [facility] Pager [phone]  If 7PM-7AM, please contact night-coverage 08/25/2020, 7:39 AM

## 2020-08-25 NOTE — Progress Notes (Signed)
Physical Therapy Treatment Patient Details Name: Susan Kim MRN: [id number] DOB: December 15, 1950 Today's Date: 08/25/2020    History of Present Illness Per MD Notes: Susan Kim is a 70 y.o. female with a past medical history of fibromyalgia and hypertension who presents via EMS from home for assessment of right hip pain after patient states she slipped on a rug falling onto her right hip. Imaging revealed Right intertrochanteric hip fracture. Pt now s/p IM nailing. To be WBAT on RLE.    PT Comments    Pt received seated in the recliner washing her body with a wash rag. Pt unable to complete LE cleansing with wash rag was assisted by PT, while pt was able to shift her hips throughout assist to allow for LE cleaning. Pt assisted with donning gown. Completed sit<>stand transfer with minA Vcs for appropriate hand placement on the chair and Rw management. Completed transfer to [facility] for pt to attempt to void, she was unable to and requested to return to bed 2/2 pain in the RLE and feeling dizzy.  Pt required mod A to complete stand pivot transfer to seated EOB 2/2 increased RLE pain. Pt requiring assist to return to supine position for RLE management. PT unable to complete hip flexion against gravity at this time requiring increased assistance and time to complete trasnfer. Pt provided Vcs for pursed lip breathing cues to maintain pain with transfers. Ice applied to R hip to mitigate pain, pt needs met with call bell in reach. RN notified of increased pt pain.    Follow Up Recommendations  SNF     Equipment Recommendations  Rolling walker with 5" wheels;3in1 (PT)    Recommendations for Other Services       Precautions / Restrictions Precautions Precautions: Fall Restrictions Weight Bearing Restrictions: Yes RLE Weight Bearing: Weight bearing as tolerated    Mobility  Bed Mobility Overal bed mobility: Needs Assistance Bed Mobility: Sit to Supine       Sit to supine: Min assist    General bed mobility comments: MIN A for RLE mgt into the bed, blocking of the LE for all sit<>stand transfers    Transfers Overall transfer level: Needs assistance Equipment used: Rolling walker (2 wheeled) Transfers: Sit to/from [facility] Sit to Stand: Min assist Stand pivot transfers: Mod assist       General transfer comment: Pt with increased pain during transfers, she required VCs for appropriate hand placement on the RW to decrease her risk for falls. Pt required modA to complete stand pivot transfer from [facility] to EOB 2/2 increased RLE pain. Pt unable to ambulate during sessions 2/2 reports of RLE pain.  Ambulation/Gait Ambulation/Gait assistance: Min assist Gait Distance (Feet): 4 Feet (Pt only able to ambulate EOB using RW with minA for RW management. She has decreased ability to appropriately acitvate LLE to ambulate compensating by using her trunk) Assistive device: Rolling walker (2 wheeled) Gait Pattern/deviations: Step-to pattern;Decreased step length - left;Decreased stance time - right;Decreased stride length Gait velocity: decreased   General Gait Details: Pt with antalgic gait, limited to 4 steps EOB 2/2 pain in RLE   Stairs             Wheelchair Mobility    Modified Rankin (Stroke Patients Only)       Balance Overall balance assessment: Needs assistance Sitting-balance support: Feet supported Sitting balance-Leahy Scale: Good Sitting balance - Comments: Steady static sitting while completing reaching activities is safe   Standing balance support: During functional activity;Bilateral  upper extremity supported Standing balance-Leahy Scale: Fair Standing balance comment: Decreased stability in the standing position 2/2 pain and decreased R LE strength                            Cognition Arousal/Alertness: Awake/alert Behavior During Therapy: WFL for tasks assessed/performed Overall Cognitive Status: Within Functional  Limits for tasks assessed                                 General Comments: Pleasant, conversational, A&O t/o session      Exercises Total Joint Exercises Ankle Circles/Pumps: AROM;Strengthening;Both;10 reps;Supine Quad Sets: AROM;Strengthening;Both;10 reps;Supine Heel Slides: AROM;Left;AAROM;Right;Strengthening;10 reps;Supine Hip ABduction/ADduction: AROM;Left;AAROM;Right;Strengthening;10 reps;Supine Other Exercises Other Exercises: Reviewed approrpiate use of RW for all ambulation to decrease falls, approrpiate use of pursed lip breathing while completing transfers to decrease her pain. Discussed with pt the importance of intermittent ambulation throughout the day to mitigate strength loss and pain in the RLE    General Comments        Pertinent Vitals/Pain Pain Assessment: Faces Faces Pain Scale: Hurts whole lot Pain Location: RLE Pain Descriptors / Indicators: Sore;Grimacing;Guarding Pain Intervention(s): Limited activity within patient's tolerance;Ice applied;Repositioned;Patient requesting pain meds-RN notified    Home Living                      Prior Function            PT Goals (current goals can now be found in the care plan section) Acute Rehab PT Goals Patient Stated Goal: to decrease pain PT Goal Formulation: With patient Time For Goal Achievement: 09/07/20 Potential to Achieve Goals: Fair Progress towards PT goals: Progressing toward goals    Frequency    BID      PT Plan Current plan remains appropriate    Co-evaluation              AM-PAC PT "6 Clicks" Mobility   Outcome Measure  Help needed turning from your back to your side while in a flat bed without using bedrails?: A Little Help needed moving from lying on your back to sitting on the side of a flat bed without using bedrails?: A Little Help needed moving to and from a bed to a chair (including a wheelchair)?: A Little Help needed standing up from a chair  using your arms (e.g., wheelchair or bedside chair)?: A Little Help needed to walk in hospital room?: A Lot Help needed climbing 3-5 steps with a railing? : Total 6 Click Score: 15    End of Session Equipment Utilized During Treatment: Gait belt Activity Tolerance: Patient tolerated treatment well;Patient limited by pain Patient left: with call bell/phone within reach;with bed alarm set;in chair Nurse Communication: Mobility status PT Visit Diagnosis: Unsteadiness on feet (R26.81);Other abnormalities of gait and mobility (R26.89);Muscle weakness (generalized) (M62.81);History of falling (Z91.81);Pain;Difficulty in walking, not elsewhere classified (R26.2) Pain - Right/Left: Right Pain - part of body: Hip     Time: 0160-1093 PT Time Calculation (min) (ACUTE ONLY): 18 min  Charges:  $Therapeutic Exercise: 8-22 mins $Therapeutic Activity: 8-22 mins                     [name], PT, DPT 08/25/20, 4:12 PM    [name] 08/25/2020, 4:07 PM

## 2020-08-25 NOTE — Progress Notes (Signed)
  Subjective: 1 Day Post-Op Procedure(s) (LRB): INTRAMEDULLARY (IM) NAIL INTERTROCHANTRIC (Right) Patient reports pain as well-controlled.   Patient is well, and has had no acute complaints or problems Plan is to go Home after hospital stay. Negative for chest pain and shortness of breath Fever: no Gastrointestinal: negative for nausea and vomiting.  Patient has had a bowel movement.  Objective: Vital signs in last 24 hours: Temp:  [97.8 F (36.6 C)-98.5 F (36.9 C)] 98.1 F (36.7 C) (06/18 0920) Pulse Rate:  [73-93] 91 (06/18 0920) Resp:  [13-20] 20 (06/18 0920) BP: (95-134)/(35-61) 134/61 (06/18 0920) SpO2:  [95 %-100 %] 100 % (06/18 0920)  Intake/Output from previous day:  Intake/Output Summary (Last 24 hours) at 08/25/2020 1043 Last data filed at 08/25/2020 1037 Gross per 24 hour  Intake 940 ml  Output 300 ml  Net 640 ml    Intake/Output this shift: Total I/O In: 180 [P.O.:180] Out: -   Labs: Recent Labs    08/23/20 1156 08/24/20 0419 08/25/20 0444  HGB 12.7 11.3* 9.0*   Recent Labs    08/24/20 0419 08/25/20 0444  WBC 5.9 5.7  RBC 3.48* 2.77*  HCT 33.4* 26.7*  PLT 215 182   Recent Labs    08/24/20 0419 08/25/20 0444  NA 132* 138  K 3.4* 3.8  CL 99 106  CO2 28 28  BUN 21 22  CREATININE 0.72 0.70  GLUCOSE 123* 122*  CALCIUM 8.4* 8.7*   Recent Labs    08/23/20 1156  INR 1.0     EXAM General - Patient is Alert, Appropriate, and Oriented Extremity - Neurovascular intact Dorsiflexion/Plantar flexion intact Compartment soft Dressing/Incision -clean, dry, no drainage noted on proximal compression dressing  Motor Function - intact, moving foot and toes well on exam.       Assessment/Plan: 1 Day Post-Op Procedure(s) (LRB): INTRAMEDULLARY (IM) NAIL INTERTROCHANTRIC (Right) Active Problems:   Closed right hip fracture, initial encounter (HCC)   Essential hypertension   Insomnia  Estimated body mass index is 33.79 kg/m as calculated from  the following:   Height as of this encounter: 5\' 2"  (1.575 m).   Weight as of this encounter: 83.8 kg. Advance diet Up with therapy  Advised RN on importance of putting on TED hose in addition to SCDs    DVT Prophylaxis - Lovenox, Ted hose, and SCDs Weight-Bearing as tolerated to right leg  Cassell Smiles, PA-C [facility] 08/25/2020, 10:43 AM

## 2020-08-25 NOTE — Anesthesia Postprocedure Evaluation (Signed)
Anesthesia Post Note  Patient: Susan Kim  Procedure(s) Performed: INTRAMEDULLARY (IM) NAIL INTERTROCHANTRIC (Right)  Patient location during evaluation: Nursing Unit Anesthesia Type: Spinal Level of consciousness: awake and alert Pain management: satisfactory to patient Vital Signs Assessment: post-procedure vital signs reviewed and stable Respiratory status: spontaneous breathing and respiratory function stable Cardiovascular status: blood pressure returned to baseline and stable Postop Assessment: no headache, no backache, patient able to bend at knees, able to ambulate and adequate PO intake Anesthetic complications: no   No notable events documented.   Last Vitals:  Vitals:   08/24/20 2350 08/25/20 0920  BP: (!) 118/51 134/61  Pulse: 92 91  Resp: 16 20  Temp: 36.9 C 36.7 C  SpO2: 100% 100%    Last Pain:  Vitals:   08/25/20 0749  TempSrc:   PainSc: 2                  Martha Clan

## 2020-08-25 NOTE — Progress Notes (Signed)
Upper R hip bandage changed due to moderate drainage. Xeroform, ABD x2, and tape applied at this time. Other two lower honey comb dressings in place, clean, dry, and intact. Pt tolerated well.

## 2020-08-25 NOTE — Plan of Care (Signed)

## 2020-08-26 DIAGNOSIS — D649 Anemia, unspecified: Secondary | ICD-10-CM

## 2020-08-26 LAB — BASIC METABOLIC PANEL
Anion gap: 4 — ABNORMAL LOW (ref 5–15)
BUN: 20 mg/dL (ref 8–23)
CO2: 27 mmol/L (ref 22–32)
Calcium: 8 mg/dL — ABNORMAL LOW (ref 8.9–10.3)
Chloride: 105 mmol/L (ref 98–111)
Creatinine, Ser: 0.57 mg/dL (ref 0.44–1.00)
GFR, Estimated: >60 mL/min (ref >60)
Glucose, Bld: 112 mg/dL — ABNORMAL HIGH (ref 70–99)
Potassium: 3.6 mmol/L (ref 3.5–5.1)
Sodium: 136 mmol/L (ref 135–145)

## 2020-08-26 LAB — CBC
HCT: 24.1 % — ABNORMAL LOW (ref 36.0–46.0)
Hemoglobin: 8.2 g/dL — ABNORMAL LOW (ref 12.0–15.0)
MCH: 33.1 pg (ref 26.0–34.0)
MCHC: 34 g/dL (ref 30.0–36.0)
MCV: 97.2 fL (ref 80.0–100.0)
Platelets: 172 10*3/uL (ref 150–400)
RBC: 2.48 MIL/uL — ABNORMAL LOW (ref 3.87–5.11)
RDW: 11.9 % (ref 11.5–15.5)
WBC: 5.4 10*3/uL (ref 4.0–10.5)
nRBC: 0 % (ref 0.0–0.2)

## 2020-08-26 NOTE — Progress Notes (Signed)
PROGRESS NOTE    Susan Kim  NAT:[id number] DOB: Aug 12, 1950 DOA: 08/23/2020 PCP: Conni Elliot, MD    Assessment & Plan:   Active Problems:   Closed right hip fracture, initial encounter [facility])   Essential hypertension   Insomnia  Right intertrochanteric fracture: secondary to mechanical fall at home. Oxycodone, morphine prn for pain. S/p intramedullary nailing of right femur. Weight bearing as tolerated to RLE as per ortho surg. OT/PT recs SNF.   Normocytic anemia: w/ likely component of acute blood loss anemia from surg as stated above. No need for a transfusion currently   Depression: severity unknown. Continue on home dose of duloxetine, bupropion  HTN: continue on home dose of HCTZ  GERD: continue on PPI, H2 blocker  Chronic constipation: continue on senna docusate  Insomnia: continue on home dose of melatonin     DVT prophylaxis: lovenox  Code Status: full  Family Communication:  Disposition Plan: likely d/c to SNF  Level of care: Med-Surg  Status is: Inpatient  Remains inpatient appropriate because:Unsafe d/c plan, IV treatments appropriate due to intensity of illness or inability to take PO, and Inpatient level of care appropriate due to severity of illness, waiting on SNF placement   Dispo: The patient is from: Home              Anticipated d/c is to: SNF              Patient currently is medically stable for d/c   Difficult to place patient : unclear    Consultants:  Ortho surg   Procedures:   Antimicrobials:   Subjective: Pt c/o right hip pain   Objective: Vitals:   08/25/20 1804 08/25/20 2021 08/26/20 0016 08/26/20 0553  BP: (!) 122/56 (!) 121/49 134/65 125/60  Pulse: 94 93 (!) 108 (!) 106  Resp: 14 18 17 17   Temp: 98 F (36.7 C) 98.6 F (37 C) 98.7 F (37.1 C) 99 F (37.2 C)  TempSrc:  Oral Oral Oral  SpO2: 100% 100% 97% 95%  Weight:      Height:        Intake/Output Summary (Last 24 hours) at 08/26/2020  0744 Last data filed at 08/25/2020 1411 Gross per 24 hour  Intake 420 ml  Output --  Net 420 ml   Filed Weights   08/23/20 1152 08/24/20 0510 08/24/20 0736  Weight: 79.4 kg 83.8 kg 83.8 kg    Examination:  General exam: Appears uncomfortable  Respiratory system: clear breath sounds b/l. No wheezes Cardiovascular system: S1 & S2+. No rubs or clicks  Gastrointestinal system: Abd is soft, NT, ND & hypoactive bowel sounds  Central nervous system: Alert and oriented. Moves all extremities Psychiatry: judgement and insight appear normal. Flat mood and affect    Data Reviewed: I have personally reviewed following labs and imaging studies  CBC: Recent Labs  Lab 08/23/20 1156 08/24/20 0419 08/25/20 0444 08/26/20 0503  WBC 5.4 5.9 5.7 5.4  NEUTROABS 4.1  --   --   --   HGB 12.7 11.3* 9.0* 8.2*  HCT 37.3 33.4* 26.7* 24.1*  MCV 96.1 96.0 96.4 97.2  PLT 235 215 182 427   Basic Metabolic Panel: Recent Labs  Lab 08/23/20 1156 08/24/20 0419 08/25/20 0444 08/26/20 0503  NA 137 132* 138 136  K 3.5 3.4* 3.8 3.6  CL 103 99 106 105  CO2 26 28 28 27   GLUCOSE 118* 123* 122* 112*  BUN 19 21 22 20   CREATININE 0.69 0.72  0.70 0.57  CALCIUM 8.7* 8.4* 8.7* 8.0*   GFR: Estimated Creatinine Clearance: 63.8 mL/min (by C-G formula based on SCr of 0.57 mg/dL). Liver Function Tests: No results for input(s): AST, ALT, ALKPHOS, BILITOT, PROT, ALBUMIN in the last 168 hours. No results for input(s): LIPASE, AMYLASE in the last 168 hours. No results for input(s): AMMONIA in the last 168 hours. Coagulation Profile: Recent Labs  Lab 08/23/20 1156  INR 1.0   Cardiac Enzymes: No results for input(s): CKTOTAL, CKMB, CKMBINDEX, TROPONINI in the last 168 hours. BNP (last 3 results) No results for input(s): PROBNP in the last 8760 hours. HbA1C: No results for input(s): HGBA1C in the last 72 hours. CBG: No results for input(s): GLUCAP in the last 168 hours. Lipid Profile: No results for  input(s): CHOL, HDL, LDLCALC, TRIG, CHOLHDL, LDLDIRECT in the last 72 hours. Thyroid Function Tests: No results for input(s): TSH, T4TOTAL, FREET4, T3FREE, THYROIDAB in the last 72 hours. Anemia Panel: No results for input(s): VITAMINB12, FOLATE, FERRITIN, TIBC, IRON, RETICCTPCT in the last 72 hours. Sepsis Labs: No results for input(s): PROCALCITON, LATICACIDVEN in the last 168 hours.  Recent Results (from the past 240 hour(s))  Resp Panel by RT-PCR (Flu A&B, Covid) Nasopharyngeal Swab     Status: None   Collection Time: 08/23/20 11:56 AM   Specimen: Nasopharyngeal Swab; Nasopharyngeal(NP) swabs in vial transport medium  Result Value Ref Range Status   SARS Coronavirus 2 by RT PCR NEGATIVE NEGATIVE Final    Comment: (NOTE) SARS-CoV-2 target nucleic acids are NOT DETECTED.  The SARS-CoV-2 RNA is generally detectable in upper respiratory specimens during the acute phase of infection. The lowest concentration of SARS-CoV-2 viral copies this assay can detect is 138 copies/mL. A negative result does not preclude SARS-Cov-2 infection and should not be used as the sole basis for treatment or other patient management decisions. A negative result may occur with  improper specimen collection/handling, submission of specimen other than nasopharyngeal swab, presence of viral mutation(s) within the areas targeted by this assay, and inadequate number of viral copies(<138 copies/mL). A negative result must be combined with clinical observations, patient history, and epidemiological information. The expected result is Negative.  Fact Sheet for Patients:  EntrepreneurPulse.com.au  Fact Sheet for Healthcare Providers:  IncredibleEmployment.be  This test is no t yet approved or cleared by the [place] FDA and  has been authorized for detection and/or diagnosis of SARS-CoV-2 by FDA under an Emergency Use Authorization (EUA). This EUA will remain  in effect  (meaning this test can be used) for the duration of the COVID-19 declaration under Section 564(b)(1) of the Act, 21 U.S.C.section 360bbb-3(b)(1), unless the authorization is terminated  or revoked sooner.       Influenza A by PCR NEGATIVE NEGATIVE Final   Influenza B by PCR NEGATIVE NEGATIVE Final    Comment: (NOTE) The Xpert Xpress SARS-CoV-2/FLU/RSV plus assay is intended as an aid in the diagnosis of influenza from Nasopharyngeal swab specimens and should not be used as a sole basis for treatment. Nasal washings and aspirates are unacceptable for Xpert Xpress SARS-CoV-2/FLU/RSV testing.  Fact Sheet for Patients: EntrepreneurPulse.com.au  Fact Sheet for Healthcare Providers: IncredibleEmployment.be  This test is not yet approved or cleared by the [place] FDA and has been authorized for detection and/or diagnosis of SARS-CoV-2 by FDA under an Emergency Use Authorization (EUA). This EUA will remain in effect (meaning this test can be used) for the duration of the COVID-19 declaration under Section 564(b)(1) of the Act,  21 U.S.C. section 360bbb-3(b)(1), unless the authorization is terminated or revoked.  Performed at [facility], [address] [place], [place] [address]   Surgical pcr screen     Status: None   Collection Time: 08/23/20  6:44 PM   Specimen: Nasal Mucosa; Nasal Swab  Result Value Ref Range Status   MRSA, PCR NEGATIVE NEGATIVE Final   Staphylococcus aureus NEGATIVE NEGATIVE Final    Comment: (NOTE) The Xpert SA Assay (FDA approved for NASAL specimens in patients 43 years of age and older), is one component of a comprehensive surveillance program. It is not intended to diagnose infection nor to guide or monitor treatment. Performed at [facility], [address] [place], [place] [address]          Radiology Studies: DG HIP OPERATIVE UNILAT W OR W/O PELVIS RIGHT  Result Date:  08/24/2020 CLINICAL DATA:  Intramedullary nail fixation of right femoral neck EXAM: OPERATIVE RIGHT HIP (WITH PELVIS IF PERFORMED) 2 VIEWS TECHNIQUE: Fluoroscopic spot image(s) were submitted for interpretation post-operatively. FLUOROSCOPY TIME:  2:09 COMPARISON:  Hip radiographs, 08/23/2020 FINDINGS: Intraoperative fluoroscopic images demonstrate intramedullary nail fixation of intratrochanteric fractures of the right hip. IMPRESSION: Intraoperative fluoroscopic images demonstrate intramedullary nail fixation of intratrochanteric fractures of the right hip. Electronically Signed   By: Eddie Candle M.D.   On: 08/24/2020 11:09        Scheduled Meds:  buPROPion  300 mg Oral Daily   docusate sodium  200 mg Oral BID   DULoxetine  60 mg Oral Daily   enoxaparin (LOVENOX) injection  40 mg Subcutaneous Q24H   famotidine  20 mg Oral Daily   hydrochlorothiazide  25 mg Oral Daily   melatonin  5 mg Oral QHS   pantoprazole  80 mg Oral Q1200   polyethylene glycol  17 g Oral Daily   Ensure Max Protein  11 oz Oral BID PC   simethicone  80 mg Oral BID   Continuous Infusions:  sodium chloride Stopped (08/25/20 0233)   methocarbamol (ROBAXIN) IV       LOS: 3 days    Time spent: 30 mins     Wyvonnia Dusky, MD [facility] Pager [phone]  If 7PM-7AM, please contact night-coverage 08/26/2020, 7:44 AM

## 2020-08-26 NOTE — NC FL2 (Signed)
[place] LEVEL OF CARE SCREENING TOOL     IDENTIFICATION  Patient Name: Susan Kim Birthdate: 14-May-1950 Sex: female Admission Date (Current Location): 08/23/2020  [facility] and [place] Number:  Engineering geologist and Address:  [facility], [address] [place], [place] [address]      Provider Number: [id number]  Attending Physician Name and Address:  Wyvonnia Dusky, MD  Relative Name and Phone Number:  Meilah Delrosario [phone]    Current Level of Care: SNF Recommended Level of Care: [place] Prior Approval Number:    Date Approved/Denied: 08/26/20 PASRR Number: [id number]  Discharge Plan: SNF    Current Diagnoses: Patient Active Problem List   Diagnosis Date Noted   Closed right hip fracture, initial encounter ([place]) 08/23/2020   Essential hypertension 08/23/2020   Insomnia 08/23/2020    Orientation RESPIRATION BLADDER Height & Weight     Self, Time, Situation, Place  Normal Continent Weight: 83.8 kg Height:  5\' 2"  (157.5 cm)  BEHAVIORAL SYMPTOMS/MOOD NEUROLOGICAL BOWEL NUTRITION STATUS      Continent Diet  AMBULATORY STATUS COMMUNICATION OF NEEDS Skin   Limited Assist Verbally Normal                       Personal Care Assistance Level of Assistance  Bathing, Feeding, Dressing Bathing Assistance: Limited assistance Feeding assistance: Limited assistance Dressing Assistance: Limited assistance     Functional Limitations Info             SPECIAL CARE FACTORS FREQUENCY  PT (By licensed PT), OT (By licensed OT)     PT Frequency: 5X a week OT Frequency: 5X a week            Contractures Contractures Info: Not present    Additional Factors Info                  Current Medications (08/26/2020):  This is the current hospital active medication list Current Facility-Administered Medications  Medication Dose Route Frequency Provider Last Rate Last Admin   0.9 %   sodium chloride infusion   Intravenous Continuous Leim Fabry, MD   Stopped at 08/25/20 0233   bisacodyl (DULCOLAX) suppository 10 mg  10 mg Rectal Daily PRN Leim Fabry, MD       buPROPion (WELLBUTRIN XL) 24 hr tablet 300 mg  300 mg Oral Daily Leim Fabry, MD   300 mg at 08/26/20 0914   docusate sodium (COLACE) capsule 200 mg  200 mg Oral BID Wyvonnia Dusky, MD   200 mg at 08/26/20 0914   DULoxetine (CYMBALTA) DR capsule 60 mg  60 mg Oral Daily Leim Fabry, MD   60 mg at 08/25/20 2037   enoxaparin (LOVENOX) injection 40 mg  40 mg Subcutaneous Q24H Leim Fabry, MD   40 mg at 08/26/20 0914   famotidine (PEPCID) tablet 20 mg  20 mg Oral Daily Leim Fabry, MD   20 mg at 08/26/20 0914   hydrochlorothiazide (HYDRODIURIL) tablet 25 mg  25 mg Oral Daily Leim Fabry, MD   25 mg at 08/26/20 0914   HYDROmorphone (DILAUDID) injection 0.2-0.4 mg  0.2-0.4 mg Intravenous Q4H PRN Leim Fabry, MD       melatonin tablet 5 mg  5 mg Oral QHS Leim Fabry, MD   5 mg at 08/25/20 2037   methocarbamol (ROBAXIN) tablet 500 mg  500 mg Oral Q6H PRN Leim Fabry, MD   500 mg at 08/26/20 1248  Or   methocarbamol (ROBAXIN) 500 mg in dextrose 5 % 50 mL IVPB  500 mg Intravenous Q6H PRN Leim Fabry, MD       metoCLOPramide (REGLAN) tablet 5-10 mg  5-10 mg Oral Q8H PRN Leim Fabry, MD       Or   metoCLOPramide (REGLAN) injection 5-10 mg  5-10 mg Intravenous Q8H PRN Leim Fabry, MD       ondansetron [facility]) tablet 4 mg  4 mg Oral Q6H PRN Leim Fabry, MD   4 mg at 08/26/20 0920   Or   ondansetron (ZOFRAN) injection 4 mg  4 mg Intravenous Q6H PRN Leim Fabry, MD       oxyCODONE (Oxy IR/ROXICODONE) immediate release tablet 2.5-5 mg  2.5-5 mg Oral Q4H PRN Leim Fabry, MD       oxyCODONE (Oxy IR/ROXICODONE) immediate release tablet 5-10 mg  5-10 mg Oral Q4H PRN Leim Fabry, MD   10 mg at 08/26/20 1422   pantoprazole (PROTONIX) EC tablet 80 mg  80 mg Oral Q1200 Leim Fabry, MD   80 mg at 08/26/20 1248    polyethylene glycol (MIRALAX / GLYCOLAX) packet 17 g  17 g Oral Daily Wyvonnia Dusky, MD   17 g at 08/26/20 0915   protein supplement (ENSURE MAX) liquid  11 oz Oral BID PC Wyvonnia Dusky, MD   11 oz at 08/26/20 0915   senna-docusate (Senokot-S) tablet 1 tablet  1 tablet Oral QHS PRN Leim Fabry, MD       simethicone [facility]) chewable tablet 80 mg  80 mg Oral BID Wyvonnia Dusky, MD   80 mg at 08/26/20 0914   sodium phosphate (FLEET) 7-19 GM/118ML enema 1 enema  1 enema Rectal Once PRN Leim Fabry, MD       traMADol Veatrice [facility]bon) tablet 50 mg  50 mg Oral Q6H PRN Leim Fabry, MD   50 mg at 08/26/20 1248     Discharge Medications: Please see discharge summary for a list of discharge medications.  Relevant Imaging Results:  Relevant Lab Results:   Additional Information    Zigmund Daniel, Dorian Pod, RN

## 2020-08-26 NOTE — Progress Notes (Signed)
  Subjective: 2 Days Post-Op Procedure(s) (LRB): INTRAMEDULLARY (IM) NAIL INTERTROCHANTRIC (Right) Patient drwosy in bed today. Received pain medication prior to visit.  Patient is well, and has had no acute complaints or problems Negative for chest pain and shortness of breath Fever: no Gastrointestinal: negative for nausea and vomiting.   Objective: Vital signs in last 24 hours: Temp:  [98 F (36.7 C)-99 F (37.2 C)] 98.6 F (37 C) (06/19 0818) Pulse Rate:  [93-108] 96 (06/19 0818) Resp:  [14-20] 20 (06/19 0818) BP: (119-134)/(49-65) 119/51 (06/19 0818) SpO2:  [95 %-100 %] 98 % (06/19 0818)  Intake/Output from previous day:  Intake/Output Summary (Last 24 hours) at 08/26/2020 1152 Last data filed at 08/26/2020 1020 Gross per 24 hour  Intake 480 ml  Output --  Net 480 ml    Intake/Output this shift: Total I/O In: 240 [P.O.:240] Out: -   Labs: Recent Labs    08/23/20 1156 08/24/20 0419 08/25/20 0444 08/26/20 0503  HGB 12.7 11.3* 9.0* 8.2*   Recent Labs    08/25/20 0444 08/26/20 0503  WBC 5.7 5.4  RBC 2.77* 2.48*  HCT 26.7* 24.1*  PLT 182 172   Recent Labs    08/25/20 0444 08/26/20 0503  NA 138 136  K 3.8 3.6  CL 106 105  CO2 28 27  BUN 22 20  CREATININE 0.70 0.57  GLUCOSE 122* 112*  CALCIUM 8.7* 8.0*   Recent Labs    08/23/20 1156  INR 1.0     EXAM General - Patient is Appropriate, Oriented, and drowsy Extremity - Neurovascular intact Dorsiflexion/Plantar flexion intact Compartment soft Dressing/Incision -clean, dry, no drainage Motor Function - intact, moving foot and toes well on exam.     Assessment/Plan: 2 Days Post-Op Procedure(s) (LRB): INTRAMEDULLARY (IM) NAIL INTERTROCHANTRIC (Right) Active Problems:   Closed right hip fracture, initial encounter ([facility])   Essential hypertension   Insomnia  Estimated body mass index is 33.79 kg/m as calculated from the following:   Height as of this encounter: 5\' 2"  (1.575 m).   Weight  as of this encounter: 83.8 kg. Advance diet Up with therapy      DVT Prophylaxis - Lovenox, Ted hose, and SCDs Weight-Bearing as tolerated to right leg  Cassell Smiles, PA-C [facility] Orthopaedic Surgery 08/26/2020, 11:52 AM

## 2020-08-26 NOTE — Progress Notes (Signed)
Physical Therapy Treatment Patient Details Name: Susan Kim MRN: [id number] DOB: September 08, 1950 Today's Date: 08/26/2020    History of Present Illness Per MD Notes: Susan Kim is a 70 y.o. female with a past medical history of fibromyalgia and hypertension who presents via EMS from home for assessment of right hip pain after patient states she slipped on a rug falling onto her right hip. Imaging revealed Right intertrochanteric hip fracture. Pt now s/p IM nailing. To be WBAT on RLE.    PT Comments    Participated in exercises as described below.  To EOB with mod a x 1 and increased time.  Steady in sitting. She is able to stand with mod a x 1 and take several shuffle steps to left with RW and min assist. To commode.  She is able to void and assisted with bathing as she was quite sweaty and sheets were wet.  Self assisted as able.  She stands and has significant difficulty with walker pivoting to R.  She then c/o dizziness and feeling faint.  Assisted back to commode then stand pivot transfer to recliner at commode side.  Pt feels better with LE's elevated.  BP taken 108/49 but with time delay it is likely it was lower during complaints.    Will check orthostatic BP's tomorrow during session.  Discussed with RN and updated tech.   Follow Up Recommendations  SNF     Equipment Recommendations  Rolling walker with 5" wheels;3in1 (PT)    Recommendations for Other Services       Precautions / Restrictions Precautions Precautions: Fall Restrictions Weight Bearing Restrictions: Yes RLE Weight Bearing: Weight bearing as tolerated    Mobility  Bed Mobility Overal bed mobility: Needs Assistance Bed Mobility: Supine to Sit   Sidelying to sit: Min assist            Transfers Overall transfer level: Needs assistance Equipment used: Rolling walker (2 wheeled) Transfers: Sit to/from [facility] Sit to Stand: Min assist;Mod assist Stand pivot transfers: Mod  assist;Min assist       General transfer comment: increased difficutty with walker as she fatigued and requires stand pivot to get to recliner due to dizziness and unable to pivot feet with walker  Ambulation/Gait Ambulation/Gait assistance: Min assist;Mod assist Gait Distance (Feet): 3 Feet Assistive device: Rolling walker (2 wheeled) Gait Pattern/deviations: Step-to pattern;Decreased step length - left;Decreased stance time - right;Decreased stride length Gait velocity: decreased   General Gait Details: very short pivoting feet steps.   Stairs             Wheelchair Mobility    Modified Rankin (Stroke Patients Only)       Balance Overall balance assessment: Needs assistance Sitting-balance support: Feet supported Sitting balance-Leahy Scale: Good Sitting balance - Comments: Steady static sitting while completing reaching activities is safe   Standing balance support: During functional activity;Bilateral upper extremity supported Standing balance-Leahy Scale: Poor                              Cognition Arousal/Alertness: Awake/alert Behavior During Therapy: WFL for tasks assessed/performed Overall Cognitive Status: Within Functional Limits for tasks assessed                                        Exercises Total Joint Exercises Ankle Circles/Pumps: AROM;Strengthening;Both;10 reps;Supine Quad Sets: AROM;Strengthening;Both;10  reps;Supine Heel Slides: AROM;Left;AAROM;Right;Strengthening;10 reps;Supine Hip ABduction/ADduction: AROM;Left;AAROM;Right;Strengthening;10 reps;Supine Other Exercises Other Exercises: to commode to void    General Comments        Pertinent Vitals/Pain Pain Assessment: Faces Faces Pain Scale: Hurts little more Pain Location: RLE Pain Descriptors / Indicators: Sore;Grimacing;Guarding Pain Intervention(s): Limited activity within patient's tolerance;Monitored during session;Repositioned    Home Living                       Prior Function            PT Goals (current goals can now be found in the care plan section) Progress towards PT goals: Progressing toward goals    Frequency    BID      PT Plan Current plan remains appropriate    Co-evaluation              AM-PAC PT "6 Clicks" Mobility   Outcome Measure  Help needed turning from your back to your side while in a flat bed without using bedrails?: A Little Help needed moving from lying on your back to sitting on the side of a flat bed without using bedrails?: A Little Help needed moving to and from a bed to a chair (including a wheelchair)?: A Little Help needed standing up from a chair using your arms (e.g., wheelchair or bedside chair)?: A Little Help needed to walk in hospital room?: A Lot Help needed climbing 3-5 steps with a railing? : Total 6 Click Score: 15    End of Session Equipment Utilized During Treatment: Gait belt Activity Tolerance: Patient tolerated treatment well;Patient limited by pain Patient left: with call bell/phone within reach;with bed alarm set;in chair Nurse Communication: Mobility status PT Visit Diagnosis: Unsteadiness on feet (R26.81);Other abnormalities of gait and mobility (R26.89);Muscle weakness (generalized) (M62.81);History of falling (Z91.81);Pain;Difficulty in walking, not elsewhere classified (R26.2) Pain - Right/Left: Right Pain - part of body: Hip     Time: 3151-7616 PT Time Calculation (min) (ACUTE ONLY): 34 min  Charges:  $Gait Training: 8-22 mins $Therapeutic Activity: 8-22 mins                    Chesley Noon, PTA 08/26/20, 1:04 PM , 1:00 PM

## 2020-08-26 NOTE — TOC Initial Note (Addendum)
Transition of Care [facility]) - Initial/Assessment Note    Patient Details  Name: Susan Kim MRN: [id number] Date of Birth: [birth year]  Transition of Care [facility]) CM/SW Contact:    Susan Masson, RN Phone Number: [phone] 08/26/2020, 2:58 PM  Clinical Narrative:                 Spoke with pt at bedside on SNF placement recommended by therapy. Pt receptive to going to [facility] or [facility] on [address] but was not sure of any other facility. RN contacted pt's son Susan Kim to inquire further on possible SNF in his area being that the pt requested to be close to her son for placement at a SNF. No facilities presented at this time from the son Susan Kim). Will encouraged family and pt to seek additional facilities for additional bed offers.  Pt states she lives alone and has the ability to seek assistance for obtaining her medications and transportation to her medical appointments.  Completed PARR# [id number] A, FL2 and send fx to both facility with a request to follow up on Monday to confirm fx received at the facilities.   Expected Discharge Plan: Skilled Nursing Facility Barriers to Discharge: Continued Medical Work up   Patient Goals and CMS Choice     Choice offered to / list presented to : Patient  Expected Discharge Plan and Services Expected Discharge Plan: [place]   Discharge Planning Services: CM Consult                                          Prior Living Arrangements/Services   Lives with:: Self Patient language and need for interpreter reviewed:: Yes        Need for Family Participation in Patient Care: Yes (Comment) Care giver support system in place?: Yes (comment)   Criminal Activity/Legal Involvement Pertinent to Current Situation/Hospitalization: No - Comment as needed  Activities of Daily Living Home Assistive Devices/Equipment: Eyeglasses ADL Screening (condition  at time of admission) Patient's cognitive ability adequate to safely complete daily activities?: Yes Is the patient deaf or have difficulty hearing?: No Does the patient have difficulty seeing, even when wearing glasses/contacts?: No Does the patient have difficulty concentrating, remembering, or making decisions?: No Patient able to express need for assistance with ADLs?: Yes Does the patient have difficulty dressing or bathing?: No Independently performs ADLs?: Yes (appropriate for developmental age) Does the patient have difficulty walking or climbing stairs?: No Weakness of Legs: None Weakness of Arms/Hands: None  Permission Sought/Granted   Permission granted to share information with : Yes, Verbal Permission Granted              Emotional Assessment Appearance:: Appears stated age Attitude/Demeanor/Rapport: Engaged Affect (typically observed): Accepting Orientation: : Oriented to Self, Oriented to Place, Oriented to  Time, Oriented to Situation Alcohol / Substance Use: Not Applicable Psych Involvement: No (comment)  Admission diagnosis:  Surgery, elective [Z41.9] Closed fracture of right hip, initial encounter ([place]) [S72.001A] Closed right hip fracture, initial encounter [facility]) [S72.001A] Patient Active Problem List   Diagnosis Date Noted   Closed right hip fracture, initial encounter ([place]) 08/23/2020   Essential hypertension 08/23/2020   Insomnia 08/23/2020   PCP:  Susan Elliot, MD Pharmacy:   [id number] [place], [place] [address] [place]  [place] [address] Phone: [phone] Fax: [phone]     Social Determinants of Health (SDOH) Interventions    Readmission Risk Interventions No flowsheet data found.

## 2020-08-27 LAB — CBC
HCT: 24.9 % — ABNORMAL LOW (ref 36.0–46.0)
Hemoglobin: 8.5 g/dL — ABNORMAL LOW (ref 12.0–15.0)
MCH: 33.2 pg (ref 26.0–34.0)
MCHC: 34.1 g/dL (ref 30.0–36.0)
MCV: 97.3 fL (ref 80.0–100.0)
Platelets: 205 10*3/uL (ref 150–400)
RBC: 2.56 MIL/uL — ABNORMAL LOW (ref 3.87–5.11)
RDW: 11.8 % (ref 11.5–15.5)
WBC: 5.1 10*3/uL (ref 4.0–10.5)
nRBC: 0 % (ref 0.0–0.2)

## 2020-08-27 LAB — BASIC METABOLIC PANEL
Anion gap: 6 (ref 5–15)
BUN: 19 mg/dL (ref 8–23)
CO2: 28 mmol/L (ref 22–32)
Calcium: 8.3 mg/dL — ABNORMAL LOW (ref 8.9–10.3)
Chloride: 101 mmol/L (ref 98–111)
Creatinine, Ser: 0.58 mg/dL (ref 0.44–1.00)
GFR, Estimated: >60 mL/min (ref >60)
Glucose, Bld: 131 mg/dL — ABNORMAL HIGH (ref 70–99)
Potassium: 3.6 mmol/L (ref 3.5–5.1)
Sodium: 135 mmol/L (ref 135–145)

## 2020-08-27 MED ORDER — BISACODYL 10 MG RE SUPP
10.0000 mg | Freq: Every day | RECTAL | Status: AC
  Administered 2020-08-27: 10 mg via RECTAL

## 2020-08-27 MED ORDER — CALCIUM CARBONATE ANTACID 500 MG PO CHEW: 800.0000 mg | CHEWABLE_TABLET | Freq: Two times a day (BID) | ORAL | Status: DC

## 2020-08-27 MED ORDER — CALCIUM CARBONATE ANTACID 500 MG PO CHEW
800.0000 mg | CHEWABLE_TABLET | Freq: Two times a day (BID) | ORAL | Status: DC | PRN
  Filled 2020-08-27: qty 4

## 2020-08-27 MED ORDER — SCOPOLAMINE 1 MG/3DAYS TD PT72
1.0000 | MEDICATED_PATCH | TRANSDERMAL | Status: DC | PRN
  Filled 2020-08-27: qty 1

## 2020-08-27 NOTE — Progress Notes (Signed)
   Subjective: 3 Days Post-Op Procedure(s) (LRB): INTRAMEDULLARY (IM) NAIL INTERTROCHANTRIC (Right) Patient reports pain as mild.   Patient is well, and has had no acute complaints or problems Denies any CP, SOB, ABD pain. + BM Dizzy with PT yesterday. Feeling better today. PT to perform orthostatics. We will continue therapy today.  Plan is to go Skilled nursing facility after hospital stay.  Objective: Vital signs in last 24 hours: Temp:  [98.6 F (37 C)-99 F (37.2 C)] 98.6 F (37 C) (06/20 0800) Pulse Rate:  [98-105] 98 (06/20 0800) Resp:  [16-18] 16 (06/20 0800) BP: (114-130)/(50-58) 124/54 (06/20 0800) SpO2:  [95 %-100 %] 100 % (06/20 0800)  Intake/Output from previous day: 06/19 0701 - 06/20 0700 In: 240 [P.O.:240] Out: -  Intake/Output this shift: No intake/output data recorded.  Recent Labs    08/25/20 0444 08/26/20 0503 08/27/20 0445  HGB 9.0* 8.2* 8.5*   Recent Labs    08/26/20 0503 08/27/20 0445  WBC 5.4 5.1  RBC 2.48* 2.56*  HCT 24.1* 24.9*  PLT 172 205   Recent Labs    08/26/20 0503 08/27/20 0445  NA 136 135  K 3.6 3.6  CL 105 101  CO2 27 28  BUN 20 19  CREATININE 0.57 0.58  GLUCOSE 112* 131*  CALCIUM 8.0* 8.3*   No results for input(s): LABPT, INR in the last 72 hours.  EXAM General - Patient is Alert, Appropriate, and Oriented Extremity - Neurovascular intact Sensation intact distally Intact pulses distally Dorsiflexion/Plantar flexion intact No cellulitis present Compartment soft Dressing - dressing C/D/I and no drainage Motor Function - intact, moving foot and toes well on exam.   Past Medical History:  Diagnosis Date   Fibromyalgia    Hypertension     Assessment/Plan:   3 Days Post-Op Procedure(s) (LRB): INTRAMEDULLARY (IM) NAIL INTERTROCHANTRIC (Right) Active Problems:   Closed right hip fracture, initial encounter ([facility])   Essential hypertension   Insomnia  Estimated body mass index is 33.79 kg/m as calculated  from the following:   Height as of this encounter: 5\' 2"  (1.575 m).   Weight as of this encounter: 83.8 kg. Advance diet Up with therapy + BM Pain well controlled Acute post op blood loss anemia - Hgb 8.5, trending up CM to assist with discharge to SNF  Follow up with [facility] in 2 weeks   DVT Prophylaxis - Lovenox, TED hose, and SCDs Weight-Bearing as tolerated to right leg   T. Rachelle Hora, PA-C [place] 08/27/2020, 8:22 AM

## 2020-08-27 NOTE — Progress Notes (Signed)
PROGRESS NOTE    Susan Kim  YNW:[id number] DOB: 03/14/1950 DOA: 08/23/2020 PCP: Conni Elliot, MD    Assessment & Plan:   Active Problems:   Closed right hip fracture, initial encounter [facility])   Essential hypertension   Insomnia  Right intertrochanteric fracture: secondary to mechanical fall at home. Oxycodone, morphine prn for pain. S/p intramedullary nailing of right femur. Weight bearing as tolerated to RLE as per ortho surg. OT/PT recs SNF.   Normocytic anemia: w/ likely component of acute blood loss anemia from surg as stated above. H&H are stable today.   Orthostatic hypotension: hold HCTZ. Start IVFs  Depression: severity unknown. Continue on home dose of duloxetine, bupropion  HTN: will hold home dose of HCTZ secondary to orthostatic hypotension   GERD: continue on H2 blocker, PPI   Chronic constipation: continue on colace, senokot  Insomnia: continue on home dose of melatonin     DVT prophylaxis: lovenox  Code Status: full  Family Communication:  Disposition Plan: likely d/c to SNF  Level of care: Med-Surg  Status is: Inpatient  Remains inpatient appropriate because:Unsafe d/c plan, IV treatments appropriate due to intensity of illness or inability to take PO, and Inpatient level of care appropriate due to severity of illness, waiting on SNF placement but pt is orthostatic today   Dispo: The patient is from: Home              Anticipated d/c is to: SNF              Patient currently is medically stable for d/c   Difficult to place patient : unclear    Consultants:  Ortho surg   Procedures:   Antimicrobials:   Subjective: Pt c/o intermittent nausea.   Objective: Vitals:   08/26/20 0818 08/26/20 1513 08/26/20 2012 08/27/20 0339  BP: (!) 119/51 (!) 114/55 (!) 117/50 (!) 130/58  Pulse: 96 100 (!) 101 (!) 105  Resp: 20 18 16 18   Temp: 98.6 F (37 C)  99 F (37.2 C) 98.6 F (37 C)  TempSrc:    Oral  SpO2: 98% 98% 95% 99%   Weight:      Height:        Intake/Output Summary (Last 24 hours) at 08/27/2020 0738 Last data filed at 08/26/2020 1020 Gross per 24 hour  Intake 240 ml  Output --  Net 240 ml   Filed Weights   08/23/20 1152 08/24/20 0510 08/24/20 0736  Weight: 79.4 kg 83.8 kg 83.8 kg    Examination:  General exam: Appears uncomfortable  Respiratory system: clear breath sounds b/l.  Cardiovascular system: S1/S2+. No rubs or clicks  Gastrointestinal system: Abd is soft, NT, ND & hypoactive bowel sounds  Central nervous system: Alert and oriented. Moves all extremities  Psychiatry: judgement and insight appear normal. Flat mood and affect    Data Reviewed: I have personally reviewed following labs and imaging studies  CBC: Recent Labs  Lab 08/23/20 1156 08/24/20 0419 08/25/20 0444 08/26/20 0503 08/27/20 0445  WBC 5.4 5.9 5.7 5.4 5.1  NEUTROABS 4.1  --   --   --   --   HGB 12.7 11.3* 9.0* 8.2* 8.5*  HCT 37.3 33.4* 26.7* 24.1* 24.9*  MCV 96.1 96.0 96.4 97.2 97.3  PLT 235 215 182 172 657   Basic Metabolic Panel: Recent Labs  Lab 08/23/20 1156 08/24/20 0419 08/25/20 0444 08/26/20 0503 08/27/20 0445  NA 137 132* 138 136 135  K 3.5 3.4* 3.8 3.6 3.6  CL  103 99 106 105 101  CO2 26 28 28 27 28   GLUCOSE 118* 123* 122* 112* 131*  BUN 19 21 22 20 19   CREATININE 0.69 0.72 0.70 0.57 0.58  CALCIUM 8.7* 8.4* 8.7* 8.0* 8.3*   GFR: Estimated Creatinine Clearance: 63.8 mL/min (by C-G formula based on SCr of 0.58 mg/dL). Liver Function Tests: No results for input(s): AST, ALT, ALKPHOS, BILITOT, PROT, ALBUMIN in the last 168 hours. No results for input(s): LIPASE, AMYLASE in the last 168 hours. No results for input(s): AMMONIA in the last 168 hours. Coagulation Profile: Recent Labs  Lab 08/23/20 1156  INR 1.0   Cardiac Enzymes: No results for input(s): CKTOTAL, CKMB, CKMBINDEX, TROPONINI in the last 168 hours. BNP (last 3 results) No results for input(s): PROBNP in the last 8760  hours. HbA1C: No results for input(s): HGBA1C in the last 72 hours. CBG: No results for input(s): GLUCAP in the last 168 hours. Lipid Profile: No results for input(s): CHOL, HDL, LDLCALC, TRIG, CHOLHDL, LDLDIRECT in the last 72 hours. Thyroid Function Tests: No results for input(s): TSH, T4TOTAL, FREET4, T3FREE, THYROIDAB in the last 72 hours. Anemia Panel: No results for input(s): VITAMINB12, FOLATE, FERRITIN, TIBC, IRON, RETICCTPCT in the last 72 hours. Sepsis Labs: No results for input(s): PROCALCITON, LATICACIDVEN in the last 168 hours.  Recent Results (from the past 240 hour(s))  Resp Panel by RT-PCR (Flu A&B, Covid) Nasopharyngeal Swab     Status: None   Collection Time: 08/23/20 11:56 AM   Specimen: Nasopharyngeal Swab; Nasopharyngeal(NP) swabs in vial transport medium  Result Value Ref Range Status   SARS Coronavirus 2 by RT PCR NEGATIVE NEGATIVE Final    Comment: (NOTE) SARS-CoV-2 target nucleic acids are NOT DETECTED.  The SARS-CoV-2 RNA is generally detectable in upper respiratory specimens during the acute phase of infection. The lowest concentration of SARS-CoV-2 viral copies this assay can detect is 138 copies/mL. A negative result does not preclude SARS-Cov-2 infection and should not be used as the sole basis for treatment or other patient management decisions. A negative result may occur with  improper specimen collection/handling, submission of specimen other than nasopharyngeal swab, presence of viral mutation(s) within the areas targeted by this assay, and inadequate number of viral copies(<138 copies/mL). A negative result must be combined with clinical observations, patient history, and epidemiological information. The expected result is Negative.  Fact Sheet for Patients:  EntrepreneurPulse.com.au  Fact Sheet for Healthcare Providers:  IncredibleEmployment.be  This test is no t yet approved or cleared by the [place] FDA and  has been authorized for detection and/or diagnosis of SARS-CoV-2 by FDA under an Emergency Use Authorization (EUA). This EUA will remain  in effect (meaning this test can be used) for the duration of the COVID-19 declaration under Section 564(b)(1) of the Act, 21 U.S.C.section 360bbb-3(b)(1), unless the authorization is terminated  or revoked sooner.       Influenza A by PCR NEGATIVE NEGATIVE Final   Influenza B by PCR NEGATIVE NEGATIVE Final    Comment: (NOTE) The Xpert Xpress SARS-CoV-2/FLU/RSV plus assay is intended as an aid in the diagnosis of influenza from Nasopharyngeal swab specimens and should not be used as a sole basis for treatment. Nasal washings and aspirates are unacceptable for Xpert Xpress SARS-CoV-2/FLU/RSV testing.  Fact Sheet for Patients: EntrepreneurPulse.com.au  Fact Sheet for Healthcare Providers: IncredibleEmployment.be  This test is not yet approved or cleared by the [place] FDA and has been authorized for detection and/or diagnosis of SARS-CoV-2 by  FDA under an Emergency Use Authorization (EUA). This EUA will remain in effect (meaning this test can be used) for the duration of the COVID-19 declaration under Section 564(b)(1) of the Act, 21 U.S.C. section 360bbb-3(b)(1), unless the authorization is terminated or revoked.  Performed at [facility], [address], [place], [place] [address]   Surgical pcr screen     Status: None   Collection Time: 08/23/20  6:44 PM   Specimen: Nasal Mucosa; Nasal Swab  Result Value Ref Range Status   MRSA, PCR NEGATIVE NEGATIVE Final   Staphylococcus aureus NEGATIVE NEGATIVE Final    Comment: (NOTE) The Xpert SA Assay (FDA approved for NASAL specimens in patients 63 years of age and older), is one component of a comprehensive surveillance program. It is not intended to diagnose infection nor to guide or monitor treatment. Performed at [facility], [address], [place], [place] [address]          Radiology Studies: No results found.      Scheduled Meds:  buPROPion  300 mg Oral Daily   docusate sodium  200 mg Oral BID   DULoxetine  60 mg Oral Daily   enoxaparin (LOVENOX) injection  40 mg Subcutaneous Q24H   famotidine  20 mg Oral Daily   hydrochlorothiazide  25 mg Oral Daily   melatonin  5 mg Oral QHS   pantoprazole  80 mg Oral Q1200   polyethylene glycol  17 g Oral Daily   Ensure Max Protein  11 oz Oral BID PC   simethicone  80 mg Oral BID   Continuous Infusions:  sodium chloride Stopped (08/25/20 0233)   methocarbamol (ROBAXIN) IV       LOS: 4 days    Time spent: 33 mins     Wyvonnia Dusky, MD [facility] Pager [phone]  If 7PM-7AM, please contact night-coverage 08/27/2020, 7:38 AM

## 2020-08-27 NOTE — TOC Progression Note (Signed)
Transition of Care [facility]) - Progression Note    Patient Details  Name: Susan Kim MRN: [id number] Date of Birth: November 04, 1950  [facility]) CM/SW Contact  Su Hilt, RN Phone Number: 08/27/2020, 9:18 AM  Clinical Narrative:    [facility] and left a message for the admission office to follow up on the request, awaiting a call back [facility] and spoke with admissions,  They stated that they are not in network with [employer]. They are unable to make a bed offer  Expected Discharge Plan: [place] Barriers to Discharge: Continued Medical Work up  Expected Discharge Plan and Services Expected Discharge Plan: [place]   Discharge Planning Services: CM Consult                                           Social Determinants of Health (SDOH) Interventions    Readmission Risk Interventions No flowsheet data found.

## 2020-08-27 NOTE — TOC Progression Note (Signed)
Transition of Care [facility]) - Progression Note    Patient Details  Name: Susan Kim MRN: [id number] Date of Birth: 1950/07/07  Transition of Care [facility]) CM/SW Contact  Su Hilt, RN Phone Number: 08/27/2020, 3:23 PM  Clinical Narrative:     [facility] and inquired about the bed offer that was requested spoke with Otila Kluver she stated that she did not have the fax from the weekend  But does have it now and will review it. I explained that her son lives near by She requested that a face sheet be faxed , faxed the face sheet awaiting a call back  Expected Discharge Plan: [place] Barriers to Discharge: Continued Medical Work up  Expected Discharge Plan and Services Expected Discharge Plan: [place]   Discharge Planning Services: CM Consult                                           Social Determinants of Health (SDOH) Interventions    Readmission Risk Interventions No flowsheet data found.

## 2020-08-27 NOTE — Progress Notes (Signed)
Physical Therapy Treatment Patient Details Name: Susan Kim MRN: [id number] DOB: 04/08/1950 Today's Date: 08/27/2020    History of Present Illness Per MD Notes: Susan Kim is a 70 y.o. female with a past medical history of fibromyalgia and hypertension who presents via EMS from home for assessment of right hip pain after patient states she slipped on a rug falling onto her right hip. Imaging revealed Right intertrochanteric hip fracture. Pt now s/p IM nailing. To be WBAT on RLE.    PT Comments    To EOB with rail and min guard. Stood with min assist and less discomfort and was able to transfer to commode at bedside with better steps/less hesitation.  Voided.  Stood for care but was unable to continue with transfer with walker due to feeling faint.  Stand pivot to recliner at bedside with min a x 1 and good steps.  Left with fee up and needs met feeling better.  BP supine 122/50 P 108 BP sitting 122/83 P 123 BP Standing 95/53 P 126 Needed to void quickly so no 3 minute standing BP.  Felt OK to get to commode. BP 95/58 once in recliner 108/58 P 107 with LE's elevated  Urine was a bit dark.  Encouraged to increase fluids and water given and accessible.  Discussed with team. BP is limiting progression of mobility.   Follow Up Recommendations  SNF     Equipment Recommendations  Rolling walker with 5" wheels;3in1 (PT)    Recommendations for Other Services       Precautions / Restrictions Precautions Precautions: Fall Restrictions Weight Bearing Restrictions: Yes RLE Weight Bearing: Weight bearing as tolerated    Mobility  Bed Mobility Overal bed mobility: Needs Assistance Bed Mobility: Supine to Sit   Sidelying to sit: Min guard       General bed mobility comments: does quite well today with rail    Transfers Overall transfer level: Needs assistance Equipment used: Rolling walker (2 wheeled) Transfers: Sit to/from [employer] Sit to Stand:  Min assist Stand pivot transfers: Min assist;Mod assist          Ambulation/Gait   Gait Distance (Feet): 3 Feet Assistive device: Rolling walker (2 wheeled)   Gait velocity: decreased   General Gait Details: improved steps today, limitied by [employer]             Wheelchair Mobility    Modified Rankin (Stroke Patients Only)       Balance Overall balance assessment: Needs assistance Sitting-balance support: Feet supported Sitting balance-Leahy Scale: Good     Standing balance support: During functional activity;Bilateral upper extremity supported Standing balance-Leahy Scale: Poor Standing balance comment: min assist for safety                            Cognition Arousal/Alertness: Awake/alert Behavior During Therapy: WFL for tasks assessed/performed Overall Cognitive Status: Within Functional Limits for tasks assessed                                        Exercises Other Exercises Other Exercises: to commode to void    General Comments        Pertinent Vitals/Pain Pain Assessment: Faces Faces Pain Scale: Hurts a little bit Pain Location: RLE Pain Descriptors / Indicators: Sore;Grimacing;Guarding Pain Intervention(s): Limited activity within patient's tolerance;Monitored during session;Repositioned;Ice applied  Home Living                      Prior Function            PT Goals (current goals can now be found in the care plan section) Progress towards PT goals: Progressing toward goals    Frequency    BID      PT Plan Current plan remains appropriate    Co-evaluation              AM-PAC PT "6 Clicks" Mobility   Outcome Measure  Help needed turning from your back to your side while in a flat bed without using bedrails?: A Little Help needed moving from lying on your back to sitting on the side of a flat bed without using bedrails?: A Little Help needed moving to and from  a bed to a chair (including a wheelchair)?: A Little Help needed standing up from a chair using your arms (e.g., wheelchair or bedside chair)?: A Little Help needed to walk in hospital room?: A Lot Help needed climbing 3-5 steps with a railing? : Total 6 Click Score: 15    End of Session Equipment Utilized During Treatment: Gait belt Activity Tolerance: Patient tolerated treatment well;Treatment limited secondary to medical complications (Comment) Patient left: with call bell/phone within reach;with bed alarm set;in chair;with chair alarm set Nurse Communication: Mobility status PT Visit Diagnosis: Unsteadiness on feet (R26.81);Other abnormalities of gait and mobility (R26.89);Muscle weakness (generalized) (M62.81);History of falling (Z91.81);Pain;Difficulty in walking, not elsewhere classified (R26.2) Pain - Right/Left: Right Pain - part of body: Hip     Time: 6979-4801 PT Time Calculation (min) (ACUTE ONLY): 25 min  Charges:  $Therapeutic Activity: 23-37 mins                    Chesley Noon, PTA 08/27/20, 9:06 AM 9:03 AM

## 2020-08-27 NOTE — Progress Notes (Signed)
Physical Therapy Treatment Patient Details Name: Susan Kim MRN: [id number] DOB: May 12, 1950 Today's Date: 08/27/2020    History of Present Illness Per MD Notes: Susan Kim is a 70 y.o. female with a past medical history of fibromyalgia and hypertension who presents via EMS from home for assessment of right hip pain after patient states she slipped on a rug falling onto her right hip. Imaging revealed Right intertrochanteric hip fracture. Pt now s/p IM nailing. To be WBAT on RLE.    PT Comments    Pt receiving fluids upon arrival.  Stating she needs to void.  Stood and transferred to commode with RW and min assist.  Bathing while on commode with pt assisting as able.  Stood for [employer] and able to transfer to recliner with same assist.  She attempted to make it to bed but dizziness stopped her and she needed to sit in recliner.  Stand pivot back to bed and positioned for comfort.  Will re-check orthostatics BP tomorrow as they remain primary barrier for progression of gait.  Overall bed mobility and transfers and ease of mobility are improving.   Follow Up Recommendations  SNF     Equipment Recommendations  Rolling walker with 5" wheels;3in1 (PT)    Recommendations for Other Services       Precautions / Restrictions Precautions Precautions: Fall Restrictions Weight Bearing Restrictions: Yes RLE Weight Bearing: Weight bearing as tolerated    Mobility  Bed Mobility Overal bed mobility: Needs Assistance Bed Mobility: Sit to Supine       Sit to supine: Mod assist        Transfers Overall transfer level: Needs assistance Equipment used: Rolling walker (2 wheeled) Transfers: Sit to/from [facility] Sit to Stand: Min assist Stand pivot transfers: Min assist          Ambulation/Gait Ambulation/Gait assistance: Min assist;Mod assist Gait Distance (Feet): 3 Feet Assistive device: Rolling walker (2 wheeled) Gait Pattern/deviations: Step-to  pattern;Decreased step length - left;Decreased stance time - right;Decreased stride length Gait velocity: decreased   General Gait Details: improved steps today, limitied by [employer]             Wheelchair Mobility    Modified Rankin (Stroke Patients Only)       Balance Overall balance assessment: Needs assistance Sitting-balance support: Feet supported Sitting balance-Leahy Scale: Good Sitting balance - Comments: Steady static sitting while completing reaching activities is safe   Standing balance support: During functional activity;Bilateral upper extremity supported Standing balance-Leahy Scale: Poor Standing balance comment: min assist for safety                            Cognition Arousal/Alertness: Awake/alert Behavior During Therapy: WFL for tasks assessed/performed Overall Cognitive Status: Within Functional Limits for tasks assessed                                        Exercises Other Exercises Other Exercises: to commode to void and for bathing    General Comments        Pertinent Vitals/Pain Pain Assessment: Faces Faces Pain Scale: Hurts little more Pain Location: RLE Pain Descriptors / Indicators: Sore;Grimacing;Guarding Pain Intervention(s): Limited activity within patient's tolerance;Monitored during session;Repositioned    Home Living  Prior Function            PT Goals (current goals can now be found in the care plan section) Progress towards PT goals: Progressing toward goals    Frequency    BID      PT Plan Current plan remains appropriate    Co-evaluation              AM-PAC PT "6 Clicks" Mobility   Outcome Measure  Help needed turning from your back to your side while in a flat bed without using bedrails?: A Little Help needed moving from lying on your back to sitting on the side of a flat bed without using bedrails?: A Little Help needed  moving to and from a bed to a chair (including a wheelchair)?: A Little Help needed standing up from a chair using your arms (e.g., wheelchair or bedside chair)?: A Little Help needed to walk in hospital room?: A Lot Help needed climbing 3-5 steps with a railing? : Total 6 Click Score: 15    End of Session Equipment Utilized During Treatment: Gait belt Activity Tolerance: Patient tolerated treatment well;Treatment limited secondary to medical complications (Comment) Patient left: with call bell/phone within reach;with bed alarm set;in bed Nurse Communication: Mobility status PT Visit Diagnosis: Unsteadiness on feet (R26.81);Other abnormalities of gait and mobility (R26.89);Muscle weakness (generalized) (M62.81);History of falling (Z91.81);Pain;Difficulty in walking, not elsewhere classified (R26.2) Pain - Right/Left: Right Pain - part of body: Hip     Time: 9381-0175 PT Time Calculation (min) (ACUTE ONLY): 24 min  Charges:  $Therapeutic Activity: 23-37 mins                    Chesley Noon, PTA 08/27/20, 2:50 PM

## 2020-08-28 LAB — CBC
HCT: 23.7 % — ABNORMAL LOW (ref 36.0–46.0)
Hemoglobin: 8.4 g/dL — ABNORMAL LOW (ref 12.0–15.0)
MCH: 33.9 pg (ref 26.0–34.0)
MCHC: 35.4 g/dL (ref 30.0–36.0)
MCV: 95.6 fL (ref 80.0–100.0)
Platelets: 251 10*3/uL (ref 150–400)
RBC: 2.48 MIL/uL — ABNORMAL LOW (ref 3.87–5.11)
RDW: 11.9 % (ref 11.5–15.5)
WBC: 4.5 10*3/uL (ref 4.0–10.5)
nRBC: 0 % (ref 0.0–0.2)

## 2020-08-28 LAB — BASIC METABOLIC PANEL
Anion gap: 5 (ref 5–15)
BUN: 16 mg/dL (ref 8–23)
CO2: 28 mmol/L (ref 22–32)
Calcium: 8.2 mg/dL — ABNORMAL LOW (ref 8.9–10.3)
Chloride: 105 mmol/L (ref 98–111)
Creatinine, Ser: 0.53 mg/dL (ref 0.44–1.00)
GFR, Estimated: >60 mL/min (ref >60)
Glucose, Bld: 114 mg/dL — ABNORMAL HIGH (ref 70–99)
Potassium: 4.2 mmol/L (ref 3.5–5.1)
Sodium: 138 mmol/L (ref 135–145)

## 2020-08-28 NOTE — Progress Notes (Signed)
PROGRESS NOTE   HPI was taken from Dr. Tobie Poet: Susan Kim is a 70 y.o. female with medical history significant for depression, generalized anxiety disorder, insomnia, constipation, migraine headaches, GERD, hypertension, presents to the emergency department for chief concerns of fall.   Patient tripped on her rug that was overturned and fell on her right hip.  She denies loss of consciousness, head trauma, syncope, neck or head pain.  Patient reports that the pain initially was a 10 out of 10 and persistent since her fall.  After pain medication, she states that the pain is 8 out of 10.   Prior to falling she denies chest pain, shortness of breath, abdominal pain, vision changes, weakness.   Social history: She is currently retired, formerly worked as an Therapist, sports. She denies tobacco use. Endorses infrequent etoh and denies recreational drug use.   Vaccinations: three doses of [facility] course from Dr. Jimmye Norman 6/17-6/21/22: Pt was found to have right hip fracture and s/p intramedullary nailing of the right femur. PT/OT recs SNF. Currently is waiting on SNF placement. Also, pt had orthostatic hypotension yesterday and pt was placed on IVFs and home dose of HCTZ was held. Orthostatics are neg today but BP is on low end of normal    Susan Kim  GDJ:[id number] DOB: 01/19/1951 DOA: 08/23/2020 PCP: Conni Elliot, MD    Assessment & Plan:   Active Problems:   Closed right hip fracture, initial encounter [facility])   Essential hypertension   Insomnia  Right intertrochanteric fracture: secondary to mechanical fall at home. Oxycodone, morphine prn for pain. S/p intramedullary nailing of right femur. Weight bearing as tolerated to RLE as per ortho surg. OT/PT recs SNF.   Normocytic anemia: w/ likely component of acute blood loss anemia likely secondary to above surg. H&H are statble   Orthostatic hypotension: hold HCTZ. Continue on IVFs & can likely d/c tomorrow. Orthostatic  vitals are neg today   Depression: severity unknown. Continue on home dose of bupropion, duloxetine   HTN: will continue to hold HCTZ as BP is on low end of normal   GERD: continue on PPI, H2 blocker  Chronic constipation: had a bowel movement yesterday. Continue on senokot   Insomnia: continue on home dose of melatonin     DVT prophylaxis: lovenox  Code Status: full  Family Communication:  Disposition Plan: likely d/c to SNF  Level of care: Med-Surg  Status is: Inpatient  Remains inpatient appropriate because:Unsafe d/c plan, IV treatments appropriate due to intensity of illness or inability to take PO, and Inpatient level of care appropriate due to severity of illness, waiting on SNF placement  Dispo: The patient is from: Home              Anticipated d/c is to: SNF              Patient currently is medically stable for d/c   Difficult to place patient : unclear    Consultants:  Ortho surg   Procedures:   Antimicrobials:   Subjective: Pt c/o malaise   Objective: Vitals:   08/27/20 1534 08/27/20 2051 08/27/20 2346 08/28/20 0357  BP: (!) 115/52 (!) 118/48 (!) 117/57 (!) 112/54  Pulse: 88 (!) 108 100 93  Resp: 16 16 16 16   Temp: 98.3 F (36.8 C) 98.4 F (36.9 C) 98.6 F (37 C) 98.5 F (36.9 C)  TempSrc: Oral Oral Oral Oral  SpO2: 100% 100% 98% 99%  Weight:      Height:  Intake/Output Summary (Last 24 hours) at 08/28/2020 0735 Last data filed at 08/27/2020 1020 Gross per 24 hour  Intake 240 ml  Output --  Net 240 ml   Filed Weights   08/23/20 1152 08/24/20 0510 08/24/20 0736  Weight: 79.4 kg 83.8 kg 83.8 kg    Examination:  General exam: Appears comfortable  Respiratory system: clear breath sounds b/l Cardiovascular system: S1 &S2+. No rubs or clicks  Gastrointestinal system: Abd is soft, NT, ND & normal bowel sounds  Central nervous system: Alert and oriented. Moves all extremities Psychiatry: judgement and insight appear normal. Flat  mood and affect    Data Reviewed: I have personally reviewed following labs and imaging studies  CBC: Recent Labs  Lab 08/23/20 1156 08/24/20 0419 08/25/20 0444 08/26/20 0503 08/27/20 0445 08/28/20 0605  WBC 5.4 5.9 5.7 5.4 5.1 4.5  NEUTROABS 4.1  --   --   --   --   --   HGB 12.7 11.3* 9.0* 8.2* 8.5* 8.4*  HCT 37.3 33.4* 26.7* 24.1* 24.9* 23.7*  MCV 96.1 96.0 96.4 97.2 97.3 95.6  PLT 235 215 182 172 205 734   Basic Metabolic Panel: Recent Labs  Lab 08/24/20 0419 08/25/20 0444 08/26/20 0503 08/27/20 0445 08/28/20 0605  NA 132* 138 136 135 138  K 3.4* 3.8 3.6 3.6 4.2  CL 99 106 105 101 105  CO2 28 28 27 28 28   GLUCOSE 123* 122* 112* 131* 114*  BUN 21 22 20 19 16   CREATININE 0.72 0.70 0.57 0.58 0.53  CALCIUM 8.4* 8.7* 8.0* 8.3* 8.2*   GFR: Estimated Creatinine Clearance: 63.8 mL/min (by C-G formula based on SCr of 0.53 mg/dL). Liver Function Tests: No results for input(s): AST, ALT, ALKPHOS, BILITOT, PROT, ALBUMIN in the last 168 hours. No results for input(s): LIPASE, AMYLASE in the last 168 hours. No results for input(s): AMMONIA in the last 168 hours. Coagulation Profile: Recent Labs  Lab 08/23/20 1156  INR 1.0   Cardiac Enzymes: No results for input(s): CKTOTAL, CKMB, CKMBINDEX, TROPONINI in the last 168 hours. BNP (last 3 results) No results for input(s): PROBNP in the last 8760 hours. HbA1C: No results for input(s): HGBA1C in the last 72 hours. CBG: No results for input(s): GLUCAP in the last 168 hours. Lipid Profile: No results for input(s): CHOL, HDL, LDLCALC, TRIG, CHOLHDL, LDLDIRECT in the last 72 hours. Thyroid Function Tests: No results for input(s): TSH, T4TOTAL, FREET4, T3FREE, THYROIDAB in the last 72 hours. Anemia Panel: No results for input(s): VITAMINB12, FOLATE, FERRITIN, TIBC, IRON, RETICCTPCT in the last 72 hours. Sepsis Labs: No results for input(s): PROCALCITON, LATICACIDVEN in the last 168 hours.  Recent Results (from the past  240 hour(s))  Resp Panel by RT-PCR (Flu A&B, Covid) Nasopharyngeal Swab     Status: None   Collection Time: 08/23/20 11:56 AM   Specimen: Nasopharyngeal Swab; Nasopharyngeal(NP) swabs in vial transport medium  Result Value Ref Range Status   SARS Coronavirus 2 by RT PCR NEGATIVE NEGATIVE Final    Comment: (NOTE) SARS-CoV-2 target nucleic acids are NOT DETECTED.  The SARS-CoV-2 RNA is generally detectable in upper respiratory specimens during the acute phase of infection. The lowest concentration of SARS-CoV-2 viral copies this assay can detect is 138 copies/mL. A negative result does not preclude SARS-Cov-2 infection and should not be used as the sole basis for treatment or other patient management decisions. A negative result may occur with  improper specimen collection/handling, submission of specimen other than nasopharyngeal swab, presence  of viral mutation(s) within the areas targeted by this assay, and inadequate number of viral copies(<138 copies/mL). A negative result must be combined with clinical observations, patient history, and epidemiological information. The expected result is Negative.  Fact Sheet for Patients:  EntrepreneurPulse.com.au  Fact Sheet for Healthcare Providers:  IncredibleEmployment.be  This test is no t yet approved or cleared by the [place] FDA and  has been authorized for detection and/or diagnosis of SARS-CoV-2 by FDA under an Emergency Use Authorization (EUA). This EUA will remain  in effect (meaning this test can be used) for the duration of the COVID-19 declaration under Section 564(b)(1) of the Act, 21 U.S.C.section 360bbb-3(b)(1), unless the authorization is terminated  or revoked sooner.       Influenza A by PCR NEGATIVE NEGATIVE Final   Influenza B by PCR NEGATIVE NEGATIVE Final    Comment: (NOTE) The Xpert Xpress SARS-CoV-2/FLU/RSV plus assay is intended as an aid in the diagnosis of influenza  from Nasopharyngeal swab specimens and should not be used as a sole basis for treatment. Nasal washings and aspirates are unacceptable for Xpert Xpress SARS-CoV-2/FLU/RSV testing.  Fact Sheet for Patients: EntrepreneurPulse.com.au  Fact Sheet for Healthcare Providers: IncredibleEmployment.be  This test is not yet approved or cleared by the [place] FDA and has been authorized for detection and/or diagnosis of SARS-CoV-2 by FDA under an Emergency Use Authorization (EUA). This EUA will remain in effect (meaning this test can be used) for the duration of the COVID-19 declaration under Section 564(b)(1) of the Act, 21 U.S.C. section 360bbb-3(b)(1), unless the authorization is terminated or revoked.  Performed at [facility], [address] [place], [place] [address]   Surgical pcr screen     Status: None   Collection Time: 08/23/20  6:44 PM   Specimen: Nasal Mucosa; Nasal Swab  Result Value Ref Range Status   MRSA, PCR NEGATIVE NEGATIVE Final   Staphylococcus aureus NEGATIVE NEGATIVE Final    Comment: (NOTE) The Xpert SA Assay (FDA approved for NASAL specimens in patients 45 years of age and older), is one component of a comprehensive surveillance program. It is not intended to diagnose infection nor to guide or monitor treatment. Performed at [facility], [address], [place], [place] [address]          Radiology Studies: No results found.      Scheduled Meds:  buPROPion  300 mg Oral Daily   docusate sodium  200 mg Oral BID   DULoxetine  60 mg Oral Daily   enoxaparin (LOVENOX) injection  40 mg Subcutaneous Q24H   famotidine  20 mg Oral Daily   melatonin  5 mg Oral QHS   pantoprazole  80 mg Oral Q1200   polyethylene glycol  17 g Oral Daily   Ensure Max Protein  11 oz Oral BID PC   simethicone  80 mg Oral BID   Continuous Infusions:  sodium chloride 75 mL/hr at 08/28/20 0014   methocarbamol  (ROBAXIN) IV       LOS: 5 days    Time spent: 30 mins     Wyvonnia Dusky, MD [facility] Pager [phone]  If 7PM-7AM, please contact night-coverage 08/28/2020, 7:35 AM

## 2020-08-28 NOTE — Progress Notes (Signed)
Physical Therapy Treatment Patient Details Name: Susan Kim MRN: [id number] DOB: 07-25-1950 Today's Date: 08/28/2020    History of Present Illness Per MD Notes: Susan Kim is a 70 y.o. female with a past medical history of fibromyalgia and hypertension who presents via EMS from home for assessment of right hip pain after patient states she slipped on a rug falling onto her right hip. Imaging revealed Right intertrochanteric hip fracture. Pt now s/p IM nailing. To be WBAT on RLE.    PT Comments    Pt was reclined in recliner upon arriving. She states pain is "pretty bad" right now.6/10 Required more assistance during session due to pain but was pre-medicated. She required more assistance from lower surface height of recliner. Vcs for technique and safety improvements. Pt struggles with advancing LLE due to pain and wt on RLE during advancement. Once seated EOB required mod assist to progress RLE back into bed. Pt will need continued skilled PT going forward to address deficits while assisting pt to PLOF.    Follow Up Recommendations  SNF     Equipment Recommendations  Rolling walker with 5" wheels;3in1 (PT)       Precautions / Restrictions Precautions Precautions: Fall Restrictions Weight Bearing Restrictions: Yes RLE Weight Bearing: Weight bearing as tolerated    Mobility  Bed Mobility Overal bed mobility: Needs Assistance Bed Mobility: Sit to Supine   Sidelying to sit: Min guard Supine to sit: Min assist;Mod assist Sit to supine: Mod assist   General bed mobility comments: mod assist progress RLE back into bed    Transfers Overall transfer level: Needs assistance Equipment used: Rolling walker (2 wheeled) Transfers: Sit to/from Stand Sit to Stand: Min assist         General transfer comment: Min assist required to stand from lower recliner surface height.  Ambulation/Gait Ambulation/Gait assistance: Min guard Gait Distance (Feet): 5 Feet Assistive  device: Rolling walker (2 wheeled) Gait Pattern/deviations: Step-to pattern;Decreased step length - left;Decreased stance time - right;Decreased stride length Gait velocity: decreased   General Gait Details: Extremely pain limited. Step to antalgic gait. Heavy reliance on UEs to decreased wt bearing during LLE advancement     Balance Overall balance assessment: Needs assistance Sitting-balance support: Feet supported Sitting balance-Leahy Scale: Good     Standing balance support: During functional activity;Bilateral upper extremity supported Standing balance-Leahy Scale: Fair Standing balance comment: reliant on UE support      Cognition Arousal/Alertness: Awake/alert Behavior During Therapy: WFL for tasks assessed/performed Overall Cognitive Status: Within Functional Limits for tasks assessed      General Comments: Pt is A and O x 4. Cooperative and pleasant      Exercises General Exercises - Upper Extremity Shoulder Flexion: AROM;Strengthening;Both;20 reps;Seated;Theraband Theraband Level (Shoulder Flexion): Level 1 (Yellow) Shoulder Extension: AROM;Strengthening;Both;20 reps;Seated;Theraband Theraband Level (Shoulder Extension): Level 1 (Yellow) Shoulder ABduction: AROM;Strengthening;Both;20 reps;Seated;Theraband Theraband Level (Shoulder Abduction): Level 1 (Yellow) Elbow Flexion: AROM;Strengthening;Both;20 reps;Seated;Theraband Theraband Level (Elbow Flexion): Level 1 (Yellow) Elbow Extension: AROM;Strengthening;Both;20 reps;Seated;Theraband Theraband Level (Elbow Extension): Level 1 (Yellow) Chair Push Up: AROM;Strengthening;Both;20 reps;Seated General Exercises - Lower Extremity Quad Sets: AROM;Strengthening;Both;20 reps;Seated Gluteal Sets: AROM;Strengthening;Both;20 reps;Seated Heel Slides: AROM;Strengthening;Both;20 reps;Seated Hip ABduction/ADduction: AROM;Strengthening;Both;20 reps;Seated Other Exercises Other Exercises: Pt educated re: HEP, pain mgmt, ECS         Pertinent Vitals/Pain Pain Assessment: 0-10 Pain Score: 6  Faces Pain Scale: Hurts little more Pain Location: RLE Pain Descriptors / Indicators: Sore;Grimacing;Guarding Pain Intervention(s): Limited activity within patient's tolerance;Monitored during session;Premedicated before session;Repositioned  PT Goals (current goals can now be found in the care plan section) Acute Rehab PT Goals Patient Stated Goal: to decrease pain Progress towards PT goals: Progressing toward goals    Frequency    BID      PT Plan Current plan remains appropriate       AM-PAC PT "6 Clicks" Mobility   Outcome Measure  Help needed turning from your back to your side while in a flat bed without using bedrails?: A Little Help needed moving from lying on your back to sitting on the side of a flat bed without using bedrails?: A Lot Help needed moving to and from a bed to a chair (including a wheelchair)?: A Little Help needed standing up from a chair using your arms (e.g., wheelchair or bedside chair)?: A Little Help needed to walk in hospital room?: A Lot Help needed climbing 3-5 steps with a railing? : A Lot 6 Click Score: 15    End of Session Equipment Utilized During Treatment: Gait belt Activity Tolerance: Patient tolerated treatment well Patient left: in bed;with call bell/phone within reach;with bed alarm set Nurse Communication: Mobility status PT Visit Diagnosis: Unsteadiness on feet (R26.81);Other abnormalities of gait and mobility (R26.89);Muscle weakness (generalized) (M62.81);History of falling (Z91.81);Pain;Difficulty in walking, not elsewhere classified (R26.2) Pain - Right/Left: Right Pain - part of body: Hip     Time: 1420-1445 PT Time Calculation (min) (ACUTE ONLY): 25 min  Charges:  $Gait Training: 8-22 mins $Therapeutic Activity: 23-37 mins                    Julaine Fusi PTA 08/28/20, 2:52 PM

## 2020-08-28 NOTE — Progress Notes (Signed)
Occupational Therapy Treatment Patient Details Name: Susan Kim MRN: [id number] DOB: 12-21-1950 Today's Date: 08/28/2020    History of present illness Per MD Notes: Susan Kim is a 70 y.o. female with a past medical history of fibromyalgia and hypertension who presents via EMS from home for assessment of right hip pain after patient states she slipped on a rug falling onto her right hip. Imaging revealed Right intertrochanteric hip fracture. Pt now s/p IM nailing. To be WBAT on RLE.   OT comments  Susan Kim was seen for OT treatment on this date. Upon arrival to room pt reclined in chair on bedpan c NT in room. MAX A perihygiene reclined in chair, pt maintains partial sidelying in recliner. MAX A for LB access reclined in chair. Pt completed seated therex as described below, requires rest breaks every ~5 reps and cues for breathing techniques. Pt making good progress toward goals. Pt continues to benefit from skilled OT services to maximize return to PLOF and minimize risk of future falls, injury, caregiver burden, and readmission. Will continue to follow POC. Discharge recommendation remains appropriate.    Follow Up Recommendations  SNF;Supervision - Intermittent    Equipment Recommendations  3 in 1 bedside commode    Recommendations for Other Services      Precautions / Restrictions Precautions Precautions: Fall Restrictions Weight Bearing Restrictions: Yes RLE Weight Bearing: Weight bearing as tolerated       Mobility Bed Mobility     General bed mobility comments: pt received and left inchair    Transfers     General transfer comment: pt deferred citing fatigue and dizziness with mobility        ADL either performed or assessed with clinical judgement   ADL Overall ADL's : Needs assistance/impaired                                       General ADL Comments: MAX A perihygiene reclined in chair, pt maintains partial sidelying in recliner  for OT acces. MAX A for LB access reclined in chair      Cognition Arousal/Alertness: Awake/alert Behavior During Therapy: WFL for tasks assessed/performed Overall Cognitive Status: Within Functional Limits for tasks assessed                                 General Comments: Pleasant, conversational, A&O t/o session. retired Museum/gallery exhibitions officer: Other exercises;General Upper Extremity;General Lower Extremity General Exercises - Upper Extremity Shoulder Flexion: AROM;Strengthening;Both;20 reps;Seated;Theraband Theraband Level (Shoulder Flexion): Level 1 (Yellow) Shoulder Extension: AROM;Strengthening;Both;20 reps;Seated;Theraband Theraband Level (Shoulder Extension): Level 1 (Yellow) Shoulder ABduction: AROM;Strengthening;Both;20 reps;Seated;Theraband Theraband Level (Shoulder Abduction): Level 1 (Yellow) Elbow Flexion: AROM;Strengthening;Both;20 reps;Seated;Theraband Theraband Level (Elbow Flexion): Level 1 (Yellow) Elbow Extension: AROM;Strengthening;Both;20 reps;Seated;Theraband Theraband Level (Elbow Extension): Level 1 (Yellow) Chair Push Up: AROM;Strengthening;Both;20 reps;Seated General Exercises - Lower Extremity Quad Sets: AROM;Strengthening;Both;20 reps;Seated Gluteal Sets: AROM;Strengthening;Both;20 reps;Seated Heel Slides: AROM;Strengthening;Both;20 reps;Seated Hip ABduction/ADduction: AROM;Strengthening;Both;20 reps;Seated Other Exercises Other Exercises: Pt educated re: HEP, pain mgmt, ECS           Pertinent Vitals/ Pain       Pain Assessment: Faces Pain Score: 5  Faces Pain Scale: Hurts a little bit Pain Location: RLE Pain Descriptors / Indicators: Sore;Grimacing;Guarding Pain Intervention(s): Limited activity within patient's tolerance;Repositioned;Ice applied  Frequency  Min 2X/week        Progress Toward Goals  OT Goals(current goals can now be found in the care plan section)  Progress towards OT goals:  Progressing toward goals  Acute Rehab OT Goals Patient Stated Goal: to decrease pain OT Goal Formulation: With patient Time For Goal Achievement: 09/07/20 Potential to Achieve Goals: Good ADL Goals Pt Will Perform Grooming: with modified independence;standing Pt Will Perform Lower Body Dressing: sit to/from stand;with modified independence;with adaptive equipment Pt Will Transfer to Toilet: bedside commode;with modified independence;ambulating Pt Will Perform Toileting - Clothing Manipulation and hygiene: sit to/from stand;with modified independence;with adaptive equipment  Plan Discharge plan remains appropriate;Frequency remains appropriate       AM-PAC OT "6 Clicks" Daily Activity     Outcome Measure   Help from another person eating meals?: None Help from another person taking care of personal grooming?: A Little Help from another person toileting, which includes using toliet, bedpan, or urinal?: A Lot Help from another person bathing (including washing, rinsing, drying)?: A Lot Help from another person to put on and taking off regular upper body clothing?: A Little Help from another person to put on and taking off regular lower body clothing?: A Lot 6 Click Score: 16    End of Session    OT Visit Diagnosis: Other abnormalities of gait and mobility (R26.89);Pain Pain - Right/Left: Right Pain - part of body: Hip   Activity Tolerance Patient tolerated treatment well   Patient Left in chair;with call bell/phone within reach   Nurse Communication          Time: 3734-2876 OT Time Calculation (min): 25 min  Charges: OT General Charges $OT Visit: 1 Visit OT Treatments $Self Care/Home Management : 8-22 mins $Therapeutic Exercise: 8-22 mins  Dessie Coma, M.S. OTR/L  08/28/20, 1:47 PM  ascom [phone]

## 2020-08-28 NOTE — Progress Notes (Addendum)
Physical Therapy Treatment Patient Details Name: Susan Kim MRN: [id number] DOB: January 01, 1951 Today's Date: 08/28/2020    History of Present Illness Per MD Notes: Susan Kim is a 70 y.o. female with a past medical history of fibromyalgia and hypertension who presents via EMS from home for assessment of right hip pain after patient states she slipped on a rug falling onto her right hip. Imaging revealed Right intertrochanteric hip fracture. Pt now s/p IM nailing. To be WBAT on RLE.    PT Comments    Pt was long sitting in bed upon arriving requesting to go to BR. Urinated 2 x during session and did have small successful BM. Was able to exit R side of bed, stand, and ambulate from [facility] to recliner ~ 8 ft away. RW was used throughout. She was limited by pain however is extremely motivated to improve. Acute PT continues to recommend DC to SNF to address deficits while assisting pt to PLOF. Pt lives independently alone. BP at rest: 110/71, upon sitting up 101/77. After 2 minutes standing c/o not feeling well with BP dropping to 106/47. Once seated and repositioned in recliner, BP 118/63.    Follow Up Recommendations  SNF     Equipment Recommendations  Rolling walker with 5" wheels;3in1 (PT)       Precautions / Restrictions Precautions Precautions: Fall Restrictions Weight Bearing Restrictions: Yes RLE Weight Bearing: Weight bearing as tolerated    Mobility  Bed Mobility Overal bed mobility: Needs Assistance Bed Mobility: Sit to Supine;Supine to Sit   Sidelying to sit: Min guard Supine to sit: Min assist;Mod assist     General bed mobility comments: Pt was able to exit R side of bed with increased time and assistance today. pain limited    Transfers Overall transfer level: Needs assistance Equipment used: Rolling walker (2 wheeled) Transfers: Sit to/from Stand Sit to Stand: Min assist         General transfer comment: Min assist for safety to stand from slightly  elevated bed height  Ambulation/Gait Ambulation/Gait assistance: Min assist Gait Distance (Feet): 8 Feet Assistive device: Rolling walker (2 wheeled) Gait Pattern/deviations: Step-to pattern;Decreased step length - left;Decreased stance time - right;Decreased stride length Gait velocity: decreased   General Gait Details: step to antalgic gait. Heavy reliance on UEs to decreased wt bearing during LLE advancement       Balance Overall balance assessment: Needs assistance Sitting-balance support: Feet supported Sitting balance-Leahy Scale: Good     Standing balance support: During functional activity;Bilateral upper extremity supported Standing balance-Leahy Scale: Fair Standing balance comment: reliant on UE support      Cognition Arousal/Alertness: Awake/alert Behavior During Therapy: WFL for tasks assessed/performed Overall Cognitive Status: Within Functional Limits for tasks assessed        General Comments: Pleasant, conversational, A&O t/o session. retired Therapist, sports             Pertinent Vitals/Pain Pain Assessment: 0-10 Pain Score: 5  Faces Pain Scale: Hurts a little bit Pain Location: RLE Pain Descriptors / Indicators: Sore;Grimacing;Guarding Pain Intervention(s): Limited activity within patient's tolerance;Monitored during session;Premedicated before session;Repositioned     PT Goals (current goals can now be found in the care plan section) Acute Rehab PT Goals Patient Stated Goal: to decrease pain Progress towards PT goals: Progressing toward goals    Frequency    BID      PT Plan Current plan remains appropriate       AM-PAC PT "6 Clicks" Mobility   Outcome Measure  Help needed turning from your back to your side while in a flat bed without using bedrails?: A Little Help needed moving from lying on your back to sitting on the side of a flat bed without using bedrails?: A Little Help needed moving to and from a bed to a chair (including a  wheelchair)?: A Little Help needed standing up from a chair using your arms (e.g., wheelchair or bedside chair)?: A Little Help needed to walk in hospital room?: A Little Help needed climbing 3-5 steps with a railing? : A Lot 6 Click Score: 17    End of Session Equipment Utilized During Treatment: Gait belt Activity Tolerance: Patient tolerated treatment well Patient left: with call bell/phone within reach;with bed alarm set;in bed Nurse Communication: Mobility status PT Visit Diagnosis: Unsteadiness on feet (R26.81);Other abnormalities of gait and mobility (R26.89);Muscle weakness (generalized) (M62.81);History of falling (Z91.81);Pain;Difficulty in walking, not elsewhere classified (R26.2) Pain - Right/Left: Right Pain - part of body: Hip     Time: 6803-2122 PT Time Calculation (min) (ACUTE ONLY): 43 min  Charges:  $Gait Training: 8-22 mins $Therapeutic Activity: 23-37 mins                     Julaine Fusi PTA 08/28/20, 1:29 PM

## 2020-08-28 NOTE — TOC Progression Note (Signed)
Transition of Care [facility]) - Progression Note    Patient Details  Name: Susan Kim MRN: [id number] Date of Birth: 06/13/1950  Transition of Care [facility]) CM/SW [place], RN Phone Number: 08/28/2020, 9:58 AM  Clinical Narrative:    Damaris Schooner with Otila Kluver at [facility] in admission, [phone] she stated that she did have the information and she would review it to see if she could offer a bed at this time, she will call me back   Expected Discharge Plan: Skilled Nursing Facility Barriers to Discharge: Continued Medical Work up  Expected Discharge Plan and Services Expected Discharge Plan: [place]   Discharge Planning Services: CM Consult                                           Social Determinants of Health (SDOH) Interventions    Readmission Risk Interventions No flowsheet data found.

## 2020-08-28 NOTE — TOC Progression Note (Signed)
Transition of Care [facility]) - Progression Note    Patient Details  Name: Susan Kim MRN: [id number] Date of Birth: [birth year]  Transition of Care [facility]) CM/SW [place], RN Phone Number: 08/28/2020, 3:37 PM  Clinical Narrative:    [facility] [place] again to check to see if they are able to offer a bed, they are not able to at this time, Sent the bed search out to [facility] and surrounding area as there are no bed offers locally, awaiting a bed offer  Expected Discharge Plan: [place] Barriers to Discharge: Continued Medical Work up  Expected Discharge Plan and Services Expected Discharge Plan: [place]   Discharge Planning Services: CM Consult                                           Social Determinants of Health (SDOH) Interventions    Readmission Risk Interventions No flowsheet data found.

## 2020-08-29 LAB — BASIC METABOLIC PANEL
Anion gap: 5 (ref 5–15)
BUN: 15 mg/dL (ref 8–23)
CO2: 27 mmol/L (ref 22–32)
Calcium: 8.1 mg/dL — ABNORMAL LOW (ref 8.9–10.3)
Chloride: 106 mmol/L (ref 98–111)
Creatinine, Ser: 0.58 mg/dL (ref 0.44–1.00)
GFR, Estimated: >60 mL/min (ref >60)
Glucose, Bld: 109 mg/dL — ABNORMAL HIGH (ref 70–99)
Potassium: 4 mmol/L (ref 3.5–5.1)
Sodium: 138 mmol/L (ref 135–145)

## 2020-08-29 LAB — CBC
HCT: 25.9 % — ABNORMAL LOW (ref 36.0–46.0)
Hemoglobin: 8.6 g/dL — ABNORMAL LOW (ref 12.0–15.0)
MCH: 32 pg (ref 26.0–34.0)
MCHC: 33.2 g/dL (ref 30.0–36.0)
MCV: 96.3 fL (ref 80.0–100.0)
Platelets: 294 10*3/uL (ref 150–400)
RBC: 2.69 MIL/uL — ABNORMAL LOW (ref 3.87–5.11)
RDW: 11.9 % (ref 11.5–15.5)
WBC: 4.4 10*3/uL (ref 4.0–10.5)
nRBC: 0 % (ref 0.0–0.2)

## 2020-08-29 MED ORDER — TRAMADOL HCL 50 MG PO TABS: 50.0000 mg | ORAL_TABLET | Freq: Four times a day (QID) | ORAL | 0 refills | Status: AC | PRN

## 2020-08-29 MED ORDER — ENOXAPARIN SODIUM 40 MG/0.4ML IJ SOSY: 40.0000 mg | PREFILLED_SYRINGE | INTRAMUSCULAR | 0 refills | Status: AC

## 2020-08-29 MED ORDER — OXYCODONE HCL 5 MG PO TABS: 2.5000 mg | ORAL_TABLET | ORAL | 0 refills | Status: AC | PRN

## 2020-08-29 MED ORDER — SUMATRIPTAN SUCCINATE 50 MG PO TABS: 50.0000 mg | ORAL_TABLET | ORAL | Status: DC | PRN

## 2020-08-29 MED ORDER — SUMATRIPTAN SUCCINATE 50 MG PO TABS
50.0000 mg | ORAL_TABLET | Freq: Once | ORAL | Status: AC
  Administered 2020-08-29: 50 mg via ORAL
  Filled 2020-08-29 (×2): qty 1

## 2020-08-29 NOTE — Progress Notes (Signed)
PT Cancellation Note  Patient Details Name: Susan Kim MRN: [id number] DOB: [birth year]   Cancelled Treatment:    Reason Eval/Treat Not Completed: Other (comment) Entered pt's room for PM session.  Pt in bed, reports she has had a busy day and walked well with PT this morning and has since been up to the [facility] 5x.  Pt very pleasantly declines and reports she will be willing to work tomorrow but is simply too worn out to try any PT this afternoon.   Kreg Shropshire, DPT 08/29/2020, 5:26 PM

## 2020-08-29 NOTE — Discharge Instructions (Addendum)
INSTRUCTIONS AFTER Surgery  Remove items at home which could result in a fall. This includes throw rugs or furniture in walking pathways ICE to the affected joint every three hours while awake for 30 minutes at a time, for at least the first 3-5 days, and then as needed for pain and swelling.  Continue to use ice for pain and swelling. You may notice swelling that will progress down to the foot and ankle.  This is normal after surgery.  Elevate your leg when you are not up walking on it.   Continue to use the breathing machine you got in the hospital (incentive spirometer) which will help keep your temperature down.  It is common for your temperature to cycle up and down following surgery, especially at night when you are not up moving around and exerting yourself.  The breathing machine keeps your lungs expanded and your temperature down.   DIET:  As you were doing prior to hospitalization, we recommend a well-balanced diet.  DRESSING / WOUND CARE / SHOWERING  Dressing changes needed.  No showering.  Staples will be removed in 2 weeks at [facility] clinic.  ACTIVITY  Increase activity slowly as tolerated, but follow the weight bearing instructions below.   No driving for 6 weeks or until further direction given by your physician.  You cannot drive while taking narcotics.  No lifting or carrying greater than 10 lbs. until further directed by your surgeon. Avoid periods of inactivity such as sitting longer than an hour when not asleep. This helps prevent blood clots.  You may return to work once you are authorized by your doctor.     WEIGHT BEARING  Weightbearing as tolerated on the right   EXERCISES Gait training and ambulation training with a walker.  Work on Armed forces operational officer.  Occupational Therapy for ADLs.  CONSTIPATION  Constipation is defined medically as fewer than three stools per week and severe constipation as less than one stool per week.  Even if you have a regular bowel  pattern at home, your normal regimen is likely to be disrupted due to multiple reasons following surgery.  Combination of anesthesia, postoperative narcotics, change in appetite and fluid intake all can affect your bowels.   YOU MUST use at least one of the following options; they are listed in order of increasing strength to get the job done.  They are all available over the counter, and you may need to use some, POSSIBLY even all of these options:    Drink plenty of fluids (prune juice may be helpful) and high fiber foods Colace 100 mg by mouth twice a day  Senokot for constipation as directed and as needed Dulcolax (bisacodyl), take with full glass of water  Miralax (polyethylene glycol) once or twice a day as needed.  If you have tried all these things and are unable to have a bowel movement in the first 3-4 days after surgery call either your surgeon or your primary doctor.    If you experience loose stools or diarrhea, hold the medications until you stool forms back up.  If your symptoms do not get better within 1 week or if they get worse, check with your doctor.  If you experience "the worst abdominal pain ever" or develop nausea or vomiting, please contact the office immediately for further recommendations for treatment.   ITCHING:  If you experience itching with your medications, try taking only a single pain pill, or even half a pain pill at a  time.  You can also use Benadryl over the counter for itching or also to help with sleep.   TED HOSE STOCKINGS:  Use stockings on both legs until for at least 2 weeks or as directed by physician office. They may be removed at night for sleeping.  MEDICATIONS:  See your medication summary on the "After Visit Summary" that nursing will review with you.  You may have some home medications which will be placed on hold until you complete the course of blood thinner medication.  It is important for you to complete the blood thinner medication as  prescribed.  PRECAUTIONS:  If you experience chest pain or shortness of breath - call 911 immediately for transfer to the hospital emergency department.   If you develop a fever greater that 101 F, purulent drainage from wound, increased redness or drainage from wound, foul odor from the wound/dressing, or calf pain - CONTACT YOUR SURGEON.                                                   FOLLOW-UP APPOINTMENTS:  If you do not already have a post-op appointment, please call the office for an appointment to be seen by your surgeon.  Guidelines for how soon to be seen are listed in your "After Visit Summary", but are typically between 1-4 weeks after surgery.  OTHER INSTRUCTIONS:     MAKE SURE YOU:  Understand these instructions.  Get help right away if you are not doing well or get worse.    Thank you for letting us be a part of your medical care team.  It is a privilege we respect greatly.  We hope these instructions will help you stay on track for a fast and full recovery!

## 2020-08-29 NOTE — TOC Progression Note (Signed)
Transition of Care [facility]) - CM/SW Discharge Note   Patient Details  Name: Susan Kim MRN: [id number] Date of Birth: 04/15/1950  Transition of Care [facility]) CM/SW Contact:  Su Hilt, RN Phone Number: 08/29/2020, 10:36 AM   Clinical Narrative:     Spoke with the patient and explained that the facilities that she requested is not available, There are other facilities in the [place] area, she stated that she does not want to go to [place] and that she would go to her [place] and [place], She called [place] while I was in the room and they agreed that she would come there, She will be going to [place]. [place] [employer], she will need a 3 in 1, She has a Conservation officer, nature, Pilar Plate will pick her up from the hospital to DC.  I called [facility] to arrange home health PT, requested [facility] PT  Final next level of care: [place] Barriers to Discharge: Continued Medical Work up   Patient Goals and CMS Choice     Choice offered to / list presented to : Patient  Discharge Placement                       Discharge Plan and Services   Discharge Planning Services: CM Consult                                 Social Determinants of Health (SDOH) Interventions     Readmission Risk Interventions No flowsheet data found.

## 2020-08-29 NOTE — Progress Notes (Signed)
   Subjective: 5 Days Post-Op Procedure(s) (LRB): INTRAMEDULLARY (IM) NAIL INTERTROCHANTRIC (Right) Patient reports pain as mild.   Patient is well, and has had no acute complaints or problems Denies any CP, SOB, ABD pain. + BM Dizzy with PT yesterday. Feeling better today. PT to perform orthostatics. We will continue therapy today.  Plan is to go Skilled nursing facility after hospital stay.  Objective: Vital signs in last 24 hours: Temp:  [98 F (36.7 C)-98.4 F (36.9 C)] 98.4 F (36.9 C) (06/22 0428) Pulse Rate:  [91-92] 92 (06/22 0428) Resp:  [16] 16 (06/22 0428) BP: (108-127)/(55-71) 127/55 (06/22 0428) SpO2:  [94 %-100 %] 94 % (06/22 0428)  Intake/Output from previous day: 06/21 0701 - 06/22 0700 In: 240 [P.O.:240] Out: -  Intake/Output this shift: No intake/output data recorded.  Recent Labs    08/27/20 0445 08/28/20 0605 08/29/20 0505  HGB 8.5* 8.4* 8.6*   Recent Labs    08/28/20 0605 08/29/20 0505  WBC 4.5 4.4  RBC 2.48* 2.69*  HCT 23.7* 25.9*  PLT 251 294   Recent Labs    08/28/20 0605 08/29/20 0505  NA 138 138  K 4.2 4.0  CL 105 106  CO2 28 27  BUN 16 15  CREATININE 0.53 0.58  GLUCOSE 114* 109*  CALCIUM 8.2* 8.1*   No results for input(s): LABPT, INR in the last 72 hours.  EXAM General - Patient is Alert, Appropriate, and Oriented Extremity - Neurovascular intact Sensation intact distally Intact pulses distally Dorsiflexion/Plantar flexion intact No cellulitis present Compartment soft Dressing - dressing C/D/I and no drainage Motor Function - intact, moving foot and toes well on exam.   Past Medical History:  Diagnosis Date   Fibromyalgia    Hypertension     Assessment/Plan:   5 Days Post-Op Procedure(s) (LRB): INTRAMEDULLARY (IM) NAIL INTERTROCHANTRIC (Right) Active Problems:   Closed right hip fracture, initial encounter (HCC)   Essential hypertension   Insomnia  Estimated body mass index is 33.79 kg/m as calculated  from the following:   Height as of this encounter: 5\' 2"  (1.575 m).   Weight as of this encounter: 83.8 kg. Advance diet Up with therapy + BM Pain well controlled Acute post op blood loss anemia - Hgb 8.6, trending up Hypotension CM to assist with discharge to SNF  Follow up with [facility] in 2 weeks   DVT Prophylaxis - Lovenox, TED hose, and SCDs Weight-Bearing as tolerated to right leg   Reche Dixon PA-C [place] 08/29/2020, 6:55 AM

## 2020-08-29 NOTE — TOC Progression Note (Signed)
Transition of Care [facility]) - Progression Note    Patient Details  Name: Donta Mcinroy MRN: [id number] Date of Birth: [birth year]  Transition of Care [facility]) CM/SW [place], RN Phone Number: 08/29/2020, 3:27 PM  Clinical Narrative:   The patient provided with a fax number to fax referral asking for a bed request to Samu[facility] [phone], faxed the request, awaiting to hear back   Expected Discharge Plan: [place] Barriers to Discharge: Barriers Resolved  Expected Discharge Plan and Services Expected Discharge Plan: [place]   Discharge Planning Services: CM Consult   Living arrangements for the past 2 months: Single Family Home                   DME Agency: [employer] Date DME Agency Contacted: 08/29/20 Time DME Agency Contacted: 0312 Representative spoke with at DME Agency: [place]: PT [place]: [facility] Date [place]: 08/29/20 Time [place]: 41 Representative spoke with at [place]: [place]   Social Determinants of Health ([place]) Interventions    Readmission Risk Interventions No flowsheet data found.

## 2020-08-29 NOTE — Progress Notes (Signed)
PROGRESS NOTE   HPI was taken from Dr. Tobie Poet: Susan Kim is a 70 y.o. female with medical history significant for depression, generalized anxiety disorder, insomnia, constipation, migraine headaches, GERD, hypertension, presents to the emergency department for chief concerns of fall.   Patient tripped on her rug that was overturned and fell on her right hip.  She denies loss of consciousness, head trauma, syncope, neck or head pain.  Patient reports that the pain initially was a 10 out of 10 and persistent since her fall.  After pain medication, she states that the pain is 8 out of 10.   Prior to falling she denies chest pain, shortness of breath, abdominal pain, vision changes, weakness.   Social history: She is currently retired, formerly worked as an Therapist, sports. She denies tobacco use. Endorses infrequent etoh and denies recreational drug use.   Vaccinations: three doses of [facility] course from Dr. Jimmye Norman 6/17-6/21/22: Pt was found to have right hip fracture and s/p intramedullary nailing of the right femur. PT/OT recs SNF. Currently is waiting on SNF placement. Also, pt had orthostatic hypotension yesterday and pt was placed on IVFs and home dose of HCTZ was held. Orthostatics are neg today but BP is on low end of normal    Susan Kim  UEA:[id number] DOB: Jun 06, 1950 DOA: 08/23/2020 PCP: Conni Elliot, MD    Assessment & Plan:   Active Problems:   Closed right hip fracture, initial encounter [facility])   Essential hypertension   Insomnia  Right intertrochanteric fracture: secondary to mechanical fall at home. Oxycodone, morphine prn for pain. S/p intramedullary nailing of right femur. Weight bearing as tolerated to RLE as per ortho surg. OT/PT recs SNF. SW at work on bed search, patient may opt for discharge home w/ home health PT. Will need to f/u with [facility] in 2 weeks. Will plan on 2 wks dvt ppx as well.  Normocytic anemia: w/ likely component of acute  blood loss anemia likely secondary to above surg. H&H are statble   Orthostatic hypotension: hold HCTZ. Tolerating po so will hold IVF.   Depression: Continue on home dose of bupropion, duloxetine   HTN: will continue to hold HCTZ as BP is on low end of normal   GERD: continue on PPI, H2 blocker  Chronic constipation: had a bowel movement yesterday. Continue on senokot, miralax  Insomnia: continue on home dose of melatonin     DVT prophylaxis: lovenox  Code Status: full  Family Communication: none @ bedside Disposition Plan: likely d/c to SNF  Level of care: Med-Surg  Status is: Inpatient  Remains inpatient appropriate because:Unsafe d/c plan, IV treatments appropriate due to intensity of illness or inability to take PO, and Inpatient level of care appropriate due to severity of illness, waiting on SNF placement  Dispo: The patient is from: Home              Anticipated d/c is to: SNF vs home w/ home health              Patient currently is medically stable for d/c   Difficult to place patient : unclear    Consultants:  Ortho surg   Procedures:   Antimicrobials:   Subjective: Pain controlled, tolerating diet.   Objective: Vitals:   08/28/20 1553 08/29/20 0428 08/29/20 0746 08/29/20 1131  BP: (!) 115/58 (!) 127/55 122/60 (!) 127/55  Pulse: 91 92 90 96  Resp: 16 16 15 16   Temp: 98.1 F (36.7 C) 98.4 F (  36.9 C) 98.2 F (36.8 C) 98.4 F (36.9 C)  TempSrc: Oral  Oral Oral  SpO2: 100% 94% 100% 100%  Weight:      Height:        Intake/Output Summary (Last 24 hours) at 08/29/2020 1433 Last data filed at 08/29/2020 1421 Gross per 24 hour  Intake 360 ml  Output --  Net 360 ml   Filed Weights   08/23/20 1152 08/24/20 0510 08/24/20 0736  Weight: 79.4 kg 83.8 kg 83.8 kg    Examination:  General exam: Appears comfortable  Respiratory system: clear breath sounds b/l Cardiovascular system: S1 &S2+. No rubs or clicks  Gastrointestinal system: Abd is soft,  NT, ND & normal bowel sounds  Central nervous system: Alert and oriented. Moves all extremities Psychiatry: judgement and insight appear normal. Flat mood and affect    Data Reviewed: I have personally reviewed following labs and imaging studies  CBC: Recent Labs  Lab 08/23/20 1156 08/24/20 0419 08/25/20 0444 08/26/20 0503 08/27/20 0445 08/28/20 0605 08/29/20 0505  WBC 5.4   < > 5.7 5.4 5.1 4.5 4.4  NEUTROABS 4.1  --   --   --   --   --   --   HGB 12.7   < > 9.0* 8.2* 8.5* 8.4* 8.6*  HCT 37.3   < > 26.7* 24.1* 24.9* 23.7* 25.9*  MCV 96.1   < > 96.4 97.2 97.3 95.6 96.3  PLT 235   < > 182 172 205 251 294   < > = values in this interval not displayed.   Basic Metabolic Panel: Recent Labs  Lab 08/25/20 0444 08/26/20 0503 08/27/20 0445 08/28/20 0605 08/29/20 0505  NA 138 136 135 138 138  K 3.8 3.6 3.6 4.2 4.0  CL 106 105 101 105 106  CO2 28 27 28 28 27   GLUCOSE 122* 112* 131* 114* 109*  BUN 22 20 19 16 15   CREATININE 0.70 0.57 0.58 0.53 0.58  CALCIUM 8.7* 8.0* 8.3* 8.2* 8.1*   GFR: Estimated Creatinine Clearance: 63.8 mL/min (by C-G formula based on SCr of 0.58 mg/dL). Liver Function Tests: No results for input(s): AST, ALT, ALKPHOS, BILITOT, PROT, ALBUMIN in the last 168 hours. No results for input(s): LIPASE, AMYLASE in the last 168 hours. No results for input(s): AMMONIA in the last 168 hours. Coagulation Profile: Recent Labs  Lab 08/23/20 1156  INR 1.0   Cardiac Enzymes: No results for input(s): CKTOTAL, CKMB, CKMBINDEX, TROPONINI in the last 168 hours. BNP (last 3 results) No results for input(s): PROBNP in the last 8760 hours. HbA1C: No results for input(s): HGBA1C in the last 72 hours. CBG: No results for input(s): GLUCAP in the last 168 hours. Lipid Profile: No results for input(s): CHOL, HDL, LDLCALC, TRIG, CHOLHDL, LDLDIRECT in the last 72 hours. Thyroid Function Tests: No results for input(s): TSH, T4TOTAL, FREET4, T3FREE, THYROIDAB in the last  72 hours. Anemia Panel: No results for input(s): VITAMINB12, FOLATE, FERRITIN, TIBC, IRON, RETICCTPCT in the last 72 hours. Sepsis Labs: No results for input(s): PROCALCITON, LATICACIDVEN in the last 168 hours.  Recent Results (from the past 240 hour(s))  Resp Panel by RT-PCR (Flu A&B, Covid) Nasopharyngeal Swab     Status: None   Collection Time: 08/23/20 11:56 AM   Specimen: Nasopharyngeal Swab; Nasopharyngeal(NP) swabs in vial transport medium  Result Value Ref Range Status   SARS Coronavirus 2 by RT PCR NEGATIVE NEGATIVE Final    Comment: (NOTE) SARS-CoV-2 target nucleic acids are NOT DETECTED.  The SARS-CoV-2 RNA is generally detectable in upper respiratory specimens during the acute phase of infection. The lowest concentration of SARS-CoV-2 viral copies this assay can detect is 138 copies/mL. A negative result does not preclude SARS-Cov-2 infection and should not be used as the sole basis for treatment or other patient management decisions. A negative result may occur with  improper specimen collection/handling, submission of specimen other than nasopharyngeal swab, presence of viral mutation(s) within the areas targeted by this assay, and inadequate number of viral copies(<138 copies/mL). A negative result must be combined with clinical observations, patient history, and epidemiological information. The expected result is Negative.  Fact Sheet for Patients:  EntrepreneurPulse.com.au  Fact Sheet for Healthcare Providers:  IncredibleEmployment.be  This test is no t yet approved or cleared by the [place] FDA and  has been authorized for detection and/or diagnosis of SARS-CoV-2 by FDA under an Emergency Use Authorization (EUA). This EUA will remain  in effect (meaning this test can be used) for the duration of the COVID-19 declaration under Section 564(b)(1) of the Act, 21 U.S.C.section 360bbb-3(b)(1), unless the authorization is  terminated  or revoked sooner.       Influenza A by PCR NEGATIVE NEGATIVE Final   Influenza B by PCR NEGATIVE NEGATIVE Final    Comment: (NOTE) The Xpert Xpress SARS-CoV-2/FLU/RSV plus assay is intended as an aid in the diagnosis of influenza from Nasopharyngeal swab specimens and should not be used as a sole basis for treatment. Nasal washings and aspirates are unacceptable for Xpert Xpress SARS-CoV-2/FLU/RSV testing.  Fact Sheet for Patients: EntrepreneurPulse.com.au  Fact Sheet for Healthcare Providers: IncredibleEmployment.be  This test is not yet approved or cleared by the [place] FDA and has been authorized for detection and/or diagnosis of SARS-CoV-2 by FDA under an Emergency Use Authorization (EUA). This EUA will remain in effect (meaning this test can be used) for the duration of the COVID-19 declaration under Section 564(b)(1) of the Act, 21 U.S.C. section 360bbb-3(b)(1), unless the authorization is terminated or revoked.  Performed at [facility], [address] [place], [place] [address]   Surgical pcr screen     Status: None   Collection Time: 08/23/20  6:44 PM   Specimen: Nasal Mucosa; Nasal Swab  Result Value Ref Range Status   MRSA, PCR NEGATIVE NEGATIVE Final   Staphylococcus aureus NEGATIVE NEGATIVE Final    Comment: (NOTE) The Xpert SA Assay (FDA approved for NASAL specimens in patients 63 years of age and older), is one component of a comprehensive surveillance program. It is not intended to diagnose infection nor to guide or monitor treatment. Performed at [facility], [address], [place], [place] [address]          Radiology Studies: No results found.      Scheduled Meds:  buPROPion  300 mg Oral Daily   docusate sodium  200 mg Oral BID   DULoxetine  60 mg Oral Daily   enoxaparin (LOVENOX) injection  40 mg Subcutaneous Q24H   famotidine  20 mg Oral Daily   melatonin   5 mg Oral QHS   pantoprazole  80 mg Oral Q1200   polyethylene glycol  17 g Oral Daily   Ensure Max Protein  11 oz Oral BID PC   simethicone  80 mg Oral BID   Continuous Infusions:  sodium chloride 75 mL/hr at 08/29/20 0202   methocarbamol (ROBAXIN) IV       LOS: 6 days    Time spent: 30  mins     Desma Maxim, MD [facility]  If 7PM-7AM, please contact night-coverage 08/29/2020, 2:33 PM

## 2020-08-29 NOTE — TOC Progression Note (Signed)
Transition of Care [facility]) - Progression Note    Patient Details  Name: Susan Kim MRN: [id number] Date of Birth: 1951-02-27  Transition of Care [facility]) CM/SW [place], RN Phone Number: 08/29/2020, 1:36 PM  Clinical Narrative:    The patient provided me with 3 more potential options to check for rehab [facility] in [place], [facility] in [place] and [facility] in [place], I called all of them and faxed the information to [facility] at [phone], Left a voice mail with Rachna at [phone],    Expected Discharge Plan: [place] Barriers to Discharge: Barriers Resolved  Expected Discharge Plan and Services Expected Discharge Plan: [place]   Discharge Planning Services: CM Consult   Living arrangements for the past 2 months: Single Family Home                   DME Agency: [employer] Date DME Agency Contacted: 08/29/20 Time DME Agency Contacted: 1735 Representative spoke with at DME Agency: [place]: PT [place]: [facility]now [facility] Date [place]: 08/29/20 Time [place]: 45 Representative spoke with at [place]: [place]   Social Determinants of Health ([place]) Interventions    Readmission Risk Interventions No flowsheet data found.

## 2020-08-29 NOTE — Progress Notes (Signed)
Physical Therapy Treatment Patient Details Name: Susan Kim MRN: [id number] DOB: 11/09/1950 Today's Date: 08/29/2020    History of Present Illness Per MD Notes: Susan Kim is a 70 y.o. female with a past medical history of fibromyalgia and hypertension who presents via EMS from home for assessment of right hip pain after patient states she slipped on a rug falling onto her right hip. Imaging revealed Right intertrochanteric hip fracture. Pt now s/p IM nailing. To be WBAT on RLE.    PT Comments     Pt was long sitting in bed upon arriving. She agrees to session and is cooperative and pleasant throughout. Requested to get OOB to have BM. Was able to exit bed with assistance. Stand to RW and take 5 steps to [facility] prior to ambulating 8 ft to recliner. PT continues to have antalgic step to gait pattern. Will attempt to advance gait distances in pm session. RN was in room at conclusion of session. Continued recommendation for SNF at DC for pt lives alone and has stairs to enter. Would greatly benefit from rehab to address deficits prior to DC home.     Follow Up Recommendations  SNF     Equipment Recommendations  Rolling walker with 5" wheels;3in1 (PT)       Precautions / Restrictions Precautions Precautions: Fall Restrictions Weight Bearing Restrictions: Yes RLE Weight Bearing: Weight bearing as tolerated    Mobility  Bed Mobility Overal bed mobility: Needs Assistance Bed Mobility: Supine to Sit;Sit to Supine     Supine to sit: Min assist;Mod assist     General bed mobility comments: min-mod assist to exit R side of bed with increased time and VCs for technique    Transfers Overall transfer level: Needs assistance Equipment used: Rolling walker (2 wheeled) Transfers: Sit to/from Stand Sit to Stand: Min assist            Ambulation/Gait Ambulation/Gait assistance: Min guard Gait Distance (Feet): 12 Feet Assistive device: Rolling walker (2 wheeled) Gait  Pattern/deviations: Step-to pattern;Decreased step length - left;Decreased stance time - right;Decreased stride length Gait velocity: decreased   General Gait Details: Extremely pain limited. Step to antalgic gait. Heavy reliance on UEs to decreased wt bearing during LLE advancement      Balance Overall balance assessment: Needs assistance Sitting-balance support: Feet supported Sitting balance-Leahy Scale: Good     Standing balance support: During functional activity;Bilateral upper extremity supported Standing balance-Leahy Scale: Fair Standing balance comment: reliant on UE support        Cognition Arousal/Alertness: Awake/alert Behavior During Therapy: WFL for tasks assessed/performed Overall Cognitive Status: Within Functional Limits for tasks assessed      General Comments: Pt is A and O x 4. Cooperative and pleasant             Pertinent Vitals/Pain Pain Assessment: 0-10 Pain Score: 6  Faces Pain Scale: Hurts a little bit Pain Location: RLE Pain Descriptors / Indicators: Sore;Grimacing;Guarding Pain Intervention(s): Limited activity within patient's tolerance;Monitored during session;Premedicated before session;Repositioned;Ice applied     PT Goals (current goals can now be found in the care plan section) Acute Rehab PT Goals Patient Stated Goal: to decrease pain Progress towards PT goals: Progressing toward goals    Frequency    BID      PT Plan Current plan remains appropriate       AM-PAC PT "6 Clicks" Mobility   Outcome Measure  Help needed turning from your back to your side while in a flat bed without  using bedrails?: A Little Help needed moving from lying on your back to sitting on the side of a flat bed without using bedrails?: A Lot Help needed moving to and from a bed to a chair (including a wheelchair)?: A Little Help needed standing up from a chair using your arms (e.g., wheelchair or bedside chair)?: A Little Help needed to walk in  hospital room?: A Little Help needed climbing 3-5 steps with a railing? : A Little 6 Click Score: 17    End of Session Equipment Utilized During Treatment: Gait belt Activity Tolerance: Patient tolerated treatment well Patient left: in chair;with call bell/phone within reach;with chair alarm set Nurse Communication: Mobility status PT Visit Diagnosis: Unsteadiness on feet (R26.81);Other abnormalities of gait and mobility (R26.89);Muscle weakness (generalized) (M62.81);History of falling (Z91.81);Pain;Difficulty in walking, not elsewhere classified (R26.2) Pain - Right/Left: Right Pain - part of body: Hip     Time: 3475-8307 PT Time Calculation (min) (ACUTE ONLY): 27 min  Charges:  $Gait Training: 8-22 mins $Therapeutic Activity: 8-22 mins                     Julaine Fusi PTA 08/29/20, 9:46 AM

## 2020-08-29 NOTE — Care Management Important Message (Signed)
Important Message  Patient Details  Name: Susan Kim MRN: [id number] Date of Birth: 12-29-1950   Medicare Important Message Given:  Yes     Juliann Pulse A Kiffany Schelling 08/29/2020, 11:24 AM

## 2020-08-30 LAB — BASIC METABOLIC PANEL
Anion gap: 6 (ref 5–15)
BUN: 21 mg/dL (ref 8–23)
CO2: 26 mmol/L (ref 22–32)
Calcium: 8.4 mg/dL — ABNORMAL LOW (ref 8.9–10.3)
Chloride: 107 mmol/L (ref 98–111)
Creatinine, Ser: 0.47 mg/dL (ref 0.44–1.00)
GFR, Estimated: >60 mL/min (ref >60)
Glucose, Bld: 109 mg/dL — ABNORMAL HIGH (ref 70–99)
Potassium: 3.8 mmol/L (ref 3.5–5.1)
Sodium: 139 mmol/L (ref 135–145)

## 2020-08-30 LAB — CBC
HCT: 26.2 % — ABNORMAL LOW (ref 36.0–46.0)
Hemoglobin: 8.7 g/dL — ABNORMAL LOW (ref 12.0–15.0)
MCH: 32.5 pg (ref 26.0–34.0)
MCHC: 33.2 g/dL (ref 30.0–36.0)
MCV: 97.8 fL (ref 80.0–100.0)
Platelets: 355 10*3/uL (ref 150–400)
RBC: 2.68 MIL/uL — ABNORMAL LOW (ref 3.87–5.11)
RDW: 12.1 % (ref 11.5–15.5)
WBC: 4.3 10*3/uL (ref 4.0–10.5)
nRBC: 0 % (ref 0.0–0.2)

## 2020-08-30 LAB — RESP PANEL BY RT-PCR (FLU A&B, COVID) ARPGX2
Influenza A by PCR: NEGATIVE
Influenza B by PCR: NEGATIVE
SARS Coronavirus 2 by RT PCR: NEGATIVE

## 2020-08-30 MED ORDER — FERROUS SULFATE 325 (65 FE) MG PO TABS
325.0000 mg | ORAL_TABLET | ORAL | Status: DC
  Administered 2020-08-30 – 2020-08-31 (×2): 325 mg via ORAL
  Filled 2020-08-30: qty 1

## 2020-08-30 NOTE — Progress Notes (Signed)
   Subjective: 6 Days Post-Op Procedure(s) (LRB): INTRAMEDULLARY (IM) NAIL INTERTROCHANTRIC (Right) Patient reports pain as mild.  Feeling better.  More energy. Patient is well, and has had no acute complaints or problems Denies any CP, SOB, ABD pain. + BM We will continue therapy today.  Plan is to go to rehab or Skilled nursing facility after hospital stay.  Objective: Vital signs in last 24 hours: Temp:  [98.2 F (36.8 C)-98.4 F (36.9 C)] 98.2 F (36.8 C) (06/23 0459) Pulse Rate:  [90-104] 104 (06/23 0459) Resp:  [15-16] 16 (06/23 0459) BP: (108-137)/(55-74) 130/58 (06/23 0459) SpO2:  [98 %-100 %] 99 % (06/23 0459)  Intake/Output from previous day: 06/22 0701 - 06/23 0700 In: 840 [P.O.:840] Out: 1750 [Urine:1750] Intake/Output this shift: No intake/output data recorded.  Recent Labs    08/28/20 0605 08/29/20 0505 08/30/20 0433  HGB 8.4* 8.6* 8.7*   Recent Labs    08/29/20 0505 08/30/20 0433  WBC 4.4 4.3  RBC 2.69* 2.68*  HCT 25.9* 26.2*  PLT 294 355   Recent Labs    08/29/20 0505 08/30/20 0433  NA 138 139  K 4.0 3.8  CL 106 107  CO2 27 26  BUN 15 21  CREATININE 0.58 0.47  GLUCOSE 109* 109*  CALCIUM 8.1* 8.4*   No results for input(s): LABPT, INR in the last 72 hours.  EXAM General - Patient is Alert, Appropriate, and Oriented Extremity - Neurovascular intact Sensation intact distally Intact pulses distally Dorsiflexion/Plantar flexion intact No cellulitis present Compartment soft Dressing - dressing C/D/I and no drainage Motor Function - intact, moving foot and toes well on exam.   Past Medical History:  Diagnosis Date   Fibromyalgia    Hypertension     Assessment/Plan:   6 Days Post-Op Procedure(s) (LRB): INTRAMEDULLARY (IM) NAIL INTERTROCHANTRIC (Right) Active Problems:   Closed right hip fracture, initial encounter (HCC)   Essential hypertension   Insomnia  Estimated body mass index is 33.79 kg/m as calculated from the  following:   Height as of this encounter: 5\' 2"  (1.575 m).   Weight as of this encounter: 83.8 kg. Advance diet Up with therapy + BM Pain well controlled Acute post op blood loss anemia - Hgb 8.7, trending up Hypotension improving CM to assist with discharge to SNF  Follow up with [facility] in 2 weeks   DVT Prophylaxis - Lovenox, TED hose, and SCDs Weight-Bearing as tolerated to right leg   Reche Dixon PA-C [place] 08/30/2020, 7:04 AM

## 2020-08-30 NOTE — Plan of Care (Signed)
  Problem: Education: Goal: Knowledge of General Education information will improve Description: Including pain rating scale, medication(s)/side effects and non-pharmacologic comfort measures Outcome: Progressing   Problem: Health Behavior/Discharge Planning: Goal: Ability to manage health-related needs will improve Outcome: Progressing   Problem: Clinical Measurements: Goal: Ability to maintain clinical measurements within normal limits will improve Outcome: Progressing Goal: Will remain free from infection Outcome: Progressing Goal: Diagnostic test results will improve Outcome: Progressing Goal: Respiratory complications will improve Outcome: Progressing Goal: Cardiovascular complication will be avoided Outcome: Progressing   Problem: Activity: Goal: Risk for activity intolerance will decrease Outcome: Progressing   Problem: Nutrition: Goal: Adequate nutrition will be maintained Outcome: Progressing   Problem: Coping: Goal: Level of anxiety will decrease Outcome: Progressing   Problem: Elimination: Goal: Will not experience complications related to bowel motility Outcome: Progressing Goal: Will not experience complications related to urinary retention Outcome: Progressing   Problem: Pain Managment: Goal: General experience of comfort will improve Outcome: Progressing   Problem: Safety: Goal: Ability to remain free from injury will improve Outcome: Progressing   Problem: Education: Goal: Verbalization of understanding the information provided (i.e., activity precautions, restrictions, etc) will improve Outcome: Progressing Goal: Individualized Educational Video(s) Outcome: Progressing   Problem: Skin Integrity: Goal: Risk for impaired skin integrity will decrease Outcome: Progressing   Problem: Activity: Goal: Ability to ambulate and perform ADLs will improve Outcome: Progressing   Problem: Clinical Measurements: Goal: Postoperative complications will be  avoided or minimized Outcome: Progressing   Problem: Self-Concept: Goal: Ability to maintain and perform role responsibilities to the fullest extent possible will improve Outcome: Progressing   Problem: Pain Management: Goal: Pain level will decrease Outcome: Progressing

## 2020-08-30 NOTE — Progress Notes (Signed)
PROGRESS NOTE   HPI was taken from Dr. Tobie Poet: Susan Kim is a 70 y.o. female with medical history significant for depression, generalized anxiety disorder, insomnia, constipation, migraine headaches, GERD, hypertension, presents to the emergency department for chief concerns of fall.   Patient tripped on her rug that was overturned and fell on her right hip.  She denies loss of consciousness, head trauma, syncope, neck or head pain.  Patient reports that the pain initially was a 10 out of 10 and persistent since her fall.  After pain medication, she states that the pain is 8 out of 10.   Prior to falling she denies chest pain, shortness of breath, abdominal pain, vision changes, weakness.   Social history: She is currently retired, formerly worked as an Therapist, sports. She denies tobacco use. Endorses infrequent etoh and denies recreational drug use.   Vaccinations: three doses of [facility] course from Dr. Jimmye Norman 6/17-6/21/22: Pt was found to have right hip fracture and s/p intramedullary nailing of the right femur. PT/OT recs SNF. Currently is waiting on SNF placement. Also, pt had orthostatic hypotension yesterday and pt was placed on IVFs and home dose of HCTZ was held. Orthostatics are neg today but BP is on low end of normal    Susan Kim  QIH:[id number] DOB: January 16, 1951 DOA: 08/23/2020 PCP: Conni Elliot, MD    Assessment & Plan:   Active Problems:   Closed right hip fracture, initial encounter [facility])   Essential hypertension   Insomnia  Right intertrochanteric fracture: secondary to mechanical fall at home. Oxycodone, morphine prn for pain. S/p intramedullary nailing of right femur. Weight bearing as tolerated to RLE as per ortho surg. OT/PT recs SNF. SW at work on bed search. Will need to f/u with [facility] in 2 weeks. Will plan on 2 wks dvt ppx as well.  Normocytic anemia: w/ likely component of acute blood loss anemia likely secondary to above surg. H&H are  statble. Start iron  Orthostatic hypotension: hold HCTZ. Tolerating po so will hold IVF.   Depression: Continue on home dose of bupropion, duloxetine   HTN: will continue to hold HCTZ as BP is on low end of normal   GERD: continue on PPI, H2 blocker  Chronic constipation: Continue on senokot, miralax  Insomnia: continue on home dose of melatonin    DVT prophylaxis: lovenox  Code Status: full  Family Communication: son updated telephonically 6/23 Disposition Plan: to SNF  Level of care: Med-Surg  Status is: Inpatient  Remains inpatient appropriate because:Unsafe d/c plan, IV treatments appropriate due to intensity of illness or inability to take PO, and Inpatient level of care appropriate due to severity of illness, waiting on SNF placement  Dispo: The patient is from: Home              Anticipated d/c is to: SNF               Patient currently is medically stable for d/c   Difficult to place patient : unclear    Consultants:  Ortho surg   Procedures:   Antimicrobials:   Subjective: Pain moderately controlled, working with pt, tolerating diet, no chest pain or sob  Objective: Vitals:   08/29/20 2328 08/30/20 0459 08/30/20 0824 08/30/20 1154  BP: 137/61 (!) 130/58 (!) 119/52 (!) 123/57  Pulse: 90 (!) 104 97 (!) 101  Resp: 16 16 18 18   Temp: 98.4 F (36.9 C) 98.2 F (36.8 C) 98.3 F (36.8 C) 98.4 F (36.9 C)  TempSrc: Oral Oral    SpO2: 98% 99% 100% 100%  Weight:      Height:        Intake/Output Summary (Last 24 hours) at 08/30/2020 1249 Last data filed at 08/30/2020 0530 Gross per 24 hour  Intake 840 ml  Output 1750 ml  Net -910 ml   Filed Weights   08/23/20 1152 08/24/20 0510 08/24/20 0736  Weight: 79.4 kg 83.8 kg 83.8 kg    Examination:  General exam: Appears comfortable  Respiratory system: clear breath sounds b/l Cardiovascular system: S1 &S2+. No rubs or clicks  Gastrointestinal system: Abd is soft, NT, ND & normal bowel sounds  Central  nervous system: Alert and oriented. Moves all extremities Psychiatry: judgement and insight appear normal. Flat affect    Data Reviewed: I have personally reviewed following labs and imaging studies  CBC: Recent Labs  Lab 08/26/20 0503 08/27/20 0445 08/28/20 0605 08/29/20 0505 08/30/20 0433  WBC 5.4 5.1 4.5 4.4 4.3  HGB 8.2* 8.5* 8.4* 8.6* 8.7*  HCT 24.1* 24.9* 23.7* 25.9* 26.2*  MCV 97.2 97.3 95.6 96.3 97.8  PLT 172 205 251 294 735   Basic Metabolic Panel: Recent Labs  Lab 08/26/20 0503 08/27/20 0445 08/28/20 0605 08/29/20 0505 08/30/20 0433  NA 136 135 138 138 139  K 3.6 3.6 4.2 4.0 3.8  CL 105 101 105 106 107  CO2 27 28 28 27 26   GLUCOSE 112* 131* 114* 109* 109*  BUN 20 19 16 15 21   CREATININE 0.57 0.58 0.53 0.58 0.47  CALCIUM 8.0* 8.3* 8.2* 8.1* 8.4*   GFR: Estimated Creatinine Clearance: 63.8 mL/min (by C-G formula based on SCr of 0.47 mg/dL). Liver Function Tests: No results for input(s): AST, ALT, ALKPHOS, BILITOT, PROT, ALBUMIN in the last 168 hours. No results for input(s): LIPASE, AMYLASE in the last 168 hours. No results for input(s): AMMONIA in the last 168 hours. Coagulation Profile: No results for input(s): INR, PROTIME in the last 168 hours.  Cardiac Enzymes: No results for input(s): CKTOTAL, CKMB, CKMBINDEX, TROPONINI in the last 168 hours. BNP (last 3 results) No results for input(s): PROBNP in the last 8760 hours. HbA1C: No results for input(s): HGBA1C in the last 72 hours. CBG: No results for input(s): GLUCAP in the last 168 hours. Lipid Profile: No results for input(s): CHOL, HDL, LDLCALC, TRIG, CHOLHDL, LDLDIRECT in the last 72 hours. Thyroid Function Tests: No results for input(s): TSH, T4TOTAL, FREET4, T3FREE, THYROIDAB in the last 72 hours. Anemia Panel: No results for input(s): VITAMINB12, FOLATE, FERRITIN, TIBC, IRON, RETICCTPCT in the last 72 hours. Sepsis Labs: No results for input(s): PROCALCITON, LATICACIDVEN in the last 168  hours.  Recent Results (from the past 240 hour(s))  Resp Panel by RT-PCR (Flu A&B, Covid) Nasopharyngeal Swab     Status: None   Collection Time: 08/23/20 11:56 AM   Specimen: Nasopharyngeal Swab; Nasopharyngeal(NP) swabs in vial transport medium  Result Value Ref Range Status   SARS Coronavirus 2 by RT PCR NEGATIVE NEGATIVE Final    Comment: (NOTE) SARS-CoV-2 target nucleic acids are NOT DETECTED.  The SARS-CoV-2 RNA is generally detectable in upper respiratory specimens during the acute phase of infection. The lowest concentration of SARS-CoV-2 viral copies this assay can detect is 138 copies/mL. A negative result does not preclude SARS-Cov-2 infection and should not be used as the sole basis for treatment or other patient management decisions. A negative result may occur with  improper specimen collection/handling, submission of specimen other than nasopharyngeal swab,  presence of viral mutation(s) within the areas targeted by this assay, and inadequate number of viral copies(<138 copies/mL). A negative result must be combined with clinical observations, patient history, and epidemiological information. The expected result is Negative.  Fact Sheet for Patients:  EntrepreneurPulse.com.au  Fact Sheet for Healthcare Providers:  IncredibleEmployment.be  This test is no t yet approved or cleared by the [place] FDA and  has been authorized for detection and/or diagnosis of SARS-CoV-2 by FDA under an Emergency Use Authorization (EUA). This EUA will remain  in effect (meaning this test can be used) for the duration of the COVID-19 declaration under Section 564(b)(1) of the Act, 21 U.S.C.section 360bbb-3(b)(1), unless the authorization is terminated  or revoked sooner.       Influenza A by PCR NEGATIVE NEGATIVE Final   Influenza B by PCR NEGATIVE NEGATIVE Final    Comment: (NOTE) The Xpert Xpress SARS-CoV-2/FLU/RSV plus assay is intended  as an aid in the diagnosis of influenza from Nasopharyngeal swab specimens and should not be used as a sole basis for treatment. Nasal washings and aspirates are unacceptable for Xpert Xpress SARS-CoV-2/FLU/RSV testing.  Fact Sheet for Patients: EntrepreneurPulse.com.au  Fact Sheet for Healthcare Providers: IncredibleEmployment.be  This test is not yet approved or cleared by the [place] FDA and has been authorized for detection and/or diagnosis of SARS-CoV-2 by FDA under an Emergency Use Authorization (EUA). This EUA will remain in effect (meaning this test can be used) for the duration of the COVID-19 declaration under Section 564(b)(1) of the Act, 21 U.S.C. section 360bbb-3(b)(1), unless the authorization is terminated or revoked.  Performed at [facility], [address], [place], [place] [address]   Surgical pcr screen     Status: None   Collection Time: 08/23/20  6:44 PM   Specimen: Nasal Mucosa; Nasal Swab  Result Value Ref Range Status   MRSA, PCR NEGATIVE NEGATIVE Final   Staphylococcus aureus NEGATIVE NEGATIVE Final    Comment: (NOTE) The Xpert SA Assay (FDA approved for NASAL specimens in patients 67 years of age and older), is one component of a comprehensive surveillance program. It is not intended to diagnose infection nor to guide or monitor treatment. Performed at [facility], [address], [place], [place] [address]          Radiology Studies: No results found.      Scheduled Meds:  buPROPion  300 mg Oral Daily   docusate sodium  200 mg Oral BID   DULoxetine  60 mg Oral Daily   enoxaparin (LOVENOX) injection  40 mg Subcutaneous Q24H   famotidine  20 mg Oral Daily   melatonin  5 mg Oral QHS   pantoprazole  80 mg Oral Q1200   polyethylene glycol  17 g Oral Daily   Ensure Max Protein  11 oz Oral BID PC   simethicone  80 mg Oral BID   Continuous Infusions:  methocarbamol  (ROBAXIN) IV       LOS: 7 days    Time spent: 30 mins     Desma Maxim, MD [facility]  If 7PM-7AM, please contact night-coverage 08/30/2020, 12:49 PM

## 2020-08-30 NOTE — TOC Progression Note (Signed)
Transition of Care [facility]) - Progression Note    Patient Details  Name: Susan Kim MRN: [id number] Date of Birth: 05/08/1950  Transition of Care [facility]) CM/SW Contact  Su Hilt, RN Phone Number: 08/30/2020, 1:13 PM  Clinical Narrative:     [facility] called and stated they do not have a bed until next week [facility] called and stated they do not have a bed until I reviewed the facilities in [place] along with the star ratings from Medicare She accepted the bed offer from [facility] in the hub and called to confirm, awaiting ins approval  Expected Discharge Plan: [place] Barriers to Discharge: Barriers Resolved  Expected Discharge Plan and Services Expected Discharge Plan: [place]   Discharge Planning Services: CM Consult   Living arrangements for the past 2 months: Single Family Home                   DME Agency: [employer] Date DME Agency Contacted: 08/29/20 Time DME Agency Contacted: [phone] Representative spoke with at DME Agency: [place]: PT [place]: [facility]) Date [place]: 08/29/20 Time [place]: 8828 Representative spoke with at [place]: [place]   Social Determinants of Health ([place]) Interventions    Readmission Risk Interventions No flowsheet data found.

## 2020-08-30 NOTE — TOC Progression Note (Signed)
Transition of Care [facility]) - Progression Note    Patient Details  Name: Belmira Daley MRN: [id number] Date of Birth: Jun 06, 1950  Transition of Care [facility]) CM/SW [place], RN Phone Number: 08/30/2020, 8:13 AM  Clinical Narrative:     Attempted to reach [facility] to follow up on the fax referral sent yesterday Unable to reach, I attempted to reach [place] at [place], left a VM for a return call to follow up on the referral sent Attempted to reach admissions at [facility] to follow up on the referral sent left a message requesting a return call Spoke with The patient and asked if her son has heard back from any of the facilities, She stated that he has not heard back from them.  She requested that I keep trying to reach them  Expected Discharge Plan: [place] Barriers to Discharge: Barriers Resolved  Expected Discharge Plan and Services Expected Discharge Plan: [place]   Discharge Planning Services: CM Consult   Living arrangements for the past 2 months: Single Family Home                   DME Agency: [employer] Date DME Agency Contacted: 08/29/20 Time DME Agency Contacted: [phone] Representative spoke with at DME Agency: [place]: PT [place]: [facility] ([facility]) Date [place]: 08/29/20 Time [place]: 22 Representative spoke with at [place]: [place]   Social Determinants of Health ([place]) Interventions    Readmission Risk Interventions No flowsheet data found.

## 2020-08-30 NOTE — Progress Notes (Signed)
Physical Therapy Treatment Patient Details Name: Susan Kim MRN: [id number] DOB: 12-25-1950 Today's Date: 08/30/2020    History of Present Illness Per MD Notes: Susan Kim is a 70 y.o. female with a past medical history of fibromyalgia and hypertension who presents via EMS from home for assessment of right hip pain after patient states she slipped on a rug falling onto her right hip. Imaging revealed Right intertrochanteric hip fracture. Pt now s/p IM nailing. To be WBAT on RLE.    PT Comments    Pt was supine in bed with HOB elevated ~ 30 degrees. Pt was agreeable to session and was pre-medicated prior to session. Pt was able to advance mobility, transfers, and gait this session. Min assist to exit R side of bed. Stood to RW with CGA and ambulated 45 ft with extremely slow, antalgic, step to pattern. Required chair follow for safety. Too fatigued after ambulation to attempt stair training. Was educated on proper performance and will encourage participation in PM session. PT continues to recommend DC to SNF due to living alone and requiring assistance with all mobility transfers and gait. Will continue to progress as able per POC.    Follow Up Recommendations  SNF;Supervision/Assistance - 24 hour;Supervision for mobility/OOB     Equipment Recommendations  Other (comment) (pt has equipment needs met)       Precautions / Restrictions Precautions Precautions: Fall Restrictions Weight Bearing Restrictions: Yes RLE Weight Bearing: Weight bearing as tolerated    Mobility  Bed Mobility Overal bed mobility: Needs Assistance Bed Mobility: Supine to Sit     Supine to sit: Min assist     General bed mobility comments: Pt was able to exit R side of bed with increase time and min assist. Heavy use of bed rail + HOB slightly elevated    Transfers Overall transfer level: Needs assistance Equipment used: Rolling walker (2 wheeled) Transfers: Sit to/from Stand Sit to Stand: Min  guard         General transfer comment: CGA for safety  Ambulation/Gait Ambulation/Gait assistance: Supervision Gait Distance (Feet): 45 Feet Assistive device: Rolling walker (2 wheeled) Gait Pattern/deviations: Step-to pattern;Decreased step length - left;Decreased stance time - right;Decreased stride length Gait velocity: decreased   General Gait Details: pt was able to ambulate 45 ft with RW with slow step to antalgic gait pattern   Stairs Stairs:  (educated on stairs however reports too fatigue to attempt at this time)              Balance Overall balance assessment: Needs assistance Sitting-balance support: Feet supported Sitting balance-Leahy Scale: Good     Standing balance support: During functional activity;Bilateral upper extremity supported Standing balance-Leahy Scale: Fair Standing balance comment: reliant on UE support           Cognition Arousal/Alertness: Awake/alert Behavior During Therapy: WFL for tasks assessed/performed Overall Cognitive Status: Within Functional Limits for tasks assessed            General Comments: Pt is A and O x 4. Cooperative and pleasant             Pertinent Vitals/Pain Pain Assessment: 0-10 Pain Score: 6  Faces Pain Scale: Hurts a little bit Pain Location: RLE Pain Descriptors / Indicators: Sore;Grimacing;Guarding Pain Intervention(s): Limited activity within patient's tolerance;Monitored during session;Premedicated before session;Repositioned;Ice applied     PT Goals (current goals can now be found in the care plan section) Acute Rehab PT Goals Patient Stated Goal: pain under control and move better  Progress towards PT goals: Progressing toward goals    Frequency    BID      PT Plan Current plan remains appropriate       AM-PAC PT "6 Clicks" Mobility   Outcome Measure  Help needed turning from your back to your side while in a flat bed without using bedrails?: A Little Help needed moving  from lying on your back to sitting on the side of a flat bed without using bedrails?: A Little Help needed moving to and from a bed to a chair (including a wheelchair)?: A Little Help needed standing up from a chair using your arms (e.g., wheelchair or bedside chair)?: A Lot Help needed to walk in hospital room?: A Lot Help needed climbing 3-5 steps with a railing? : A Lot 6 Click Score: 15    End of Session   Activity Tolerance: Patient tolerated treatment well Patient left: in chair;with call bell/phone within reach;with chair alarm set Nurse Communication: Mobility status PT Visit Diagnosis: Unsteadiness on feet (R26.81);Other abnormalities of gait and mobility (R26.89);Muscle weakness (generalized) (M62.81);History of falling (Z91.81);Pain;Difficulty in walking, not elsewhere classified (R26.2) Pain - Right/Left: Right Pain - part of body: Hip     Time: 1010-1055 PT Time Calculation (min) (ACUTE ONLY): 45 min  Charges:  $Gait Training: 23-37 mins $Therapeutic Activity: 8-22 mins                    Julaine Fusi PTA 08/30/20, 12:17 PM

## 2020-08-30 NOTE — Progress Notes (Signed)
Nutrition Follow-up  DOCUMENTATION CODES:  Obesity unspecified  INTERVENTION:  Continue current diet as ordered, encouraged pt to work with dining staff to select meals she enjoys Ensure Max po BID, each supplement provides 150 kcal and 30 grams of protein.  Continue vitamin d daily  NUTRITION DIAGNOSIS:  Increased nutrient needs related to hip fracture as evidenced by estimated needs.  GOAL:  Patient will meet greater than or equal to 90% of their needs  MONITOR:  PO intake, Supplement acceptance, Labs, Skin  REASON FOR ASSESSMENT:  Consult Hip fracture protocol  ASSESSMENT  Pt with a PMH of fibromyalgia and HTN presented to ED from home for assessment of right hip pain after slipping on a rug falling onto her right hip. Imaging in ED showed a right intertrochanteric hip fracture.   6/17 - Op, Intramedullary nailing of Right femur with cephalomedullary device  Pt resting in bedside chair at the time of assessment. Reports that overall, her intake has been fair, but she has not been eating as much the last few days. Pt reports she has received several meals that were poor quality (wilted lettuce, receiving items she did not want, etc) and this caused her to lose her appetite. Encouraged pt to call kitchen if she was not satisfied with the quality of her food as they want to make sure she is receiving meals she enjoys.   Pt reports she is receiving the ensures and has been consuming them. Prefers the Soil scientist. Hopeful to be able to go to rehab soon. Lives at home alone at baseline and reports she always has had a good appetite (3 meals and snacks each day)  Average Meal Intake: 6/17-6/22: 83% intake x 9 recorded meals (50-100%)  Nutritionally Relevant Medications: Scheduled Meds:  docusate sodium  200 mg Oral BID   famotidine  20 mg Oral Daily   pantoprazole  80 mg Oral Q1200   polyethylene glycol  17 g Oral Daily   Ensure Max Protein  11 oz Oral BID PC   simethicone   80 mg Oral BID   PRN Meds: bisacodyl, calcium carbonate, metoclopramide, ondansetron, senna-docusate,sodium phosphate  Labs Reviewed: SBG ranges from 109 mg/dL over the last 24 hours  NUTRITION - FOCUSED PHYSICAL EXAM: Flowsheet Row Most Recent Value  Orbital Region No depletion  Upper Arm Region No depletion  Thoracic and Lumbar Region No depletion  Buccal Region No depletion  Temple Region No depletion  Clavicle Bone Region No depletion  Clavicle and Acromion Bone Region No depletion  Scapular Bone Region No depletion  Dorsal Hand No depletion  Patellar Region No depletion  Anterior Thigh Region No depletion  Posterior Calf Region No depletion  Edema (RD Assessment) Mild  Hair Reviewed  Eyes Reviewed  Mouth Reviewed  Skin Reviewed  Nails Reviewed   Diet Order:   Diet Order             Diet regular Room service appropriate? Yes; Fluid consistency: Thin  Diet effective now                  EDUCATION NEEDS:  No education needs have been identified at this time  Skin:  Skin Assessment: Skin Integrity Issues: Skin Integrity Issues:: Incisions Incisions: right hip  Last BM:  6/22 - type 4  Height:  Ht Readings from Last 1 Encounters:  08/24/20 5\' 2"  (1.575 m)   Weight:  Wt Readings from Last 1 Encounters:  08/24/20 83.8 kg    Ideal Body  Weight:  50 kg  BMI:  Body mass index is 33.79 kg/m.  Estimated Nutritional Needs:  Kcal:  1600-1800 kcal Protein:  80-95 g/d Fluid:  >1.8L/d   Ranell Patrick, RD, LDN Clinical Dietitian Pager on [place]

## 2020-08-30 NOTE — Progress Notes (Signed)
PT Cancellation Note  Patient Details Name: Susan Kim MRN: [id number] DOB: 1950-08-28   Cancelled Treatment:     PT attempt. PT hold. Pt asleep upon entry requesting holding PM session. Will return tomorrow and continue to follow per POC.    Willette Pa 08/30/2020, 4:31 PM

## 2020-08-30 NOTE — TOC Progression Note (Signed)
Transition of Care [facility]) - Progression Note    Patient Details  Name: Ivory Maduro MRN: [id number] Date of Birth: [birth year]  [facility]) CM/SW Contact  Su Hilt, RN Phone Number: 08/30/2020, 4:23 PM  Clinical Narrative:     Received approval from [facility], She can DC in the AM to [facility] in [place]  Expected Discharge Plan: [place] Barriers to Discharge: Barriers Resolved  Expected Discharge Plan and Services Expected Discharge Plan: [place]   Discharge Planning Services: CM Consult   Living arrangements for the past 2 months: Single Family Home                   DME Agency: [employer] Date DME Agency Contacted: 08/29/20 Time DME Agency Contacted: [phone] Representative spoke with at DME Agency: [place]: PT [place]: [facility] Date [place]: 08/29/20 Time [place]: 12 Representative spoke with at [place]: [place]   Social Determinants of Health ([place]) Interventions    Readmission Risk Interventions No flowsheet data found.

## 2020-08-31 MED ORDER — FERROUS SULFATE 325 (65 FE) MG PO TABS: 325.0000 mg | ORAL_TABLET | ORAL | 3 refills | Status: AC

## 2020-08-31 NOTE — Plan of Care (Signed)
  Problem: Education: Goal: Knowledge of General Education information will improve Description: Including pain rating scale, medication(s)/side effects and non-pharmacologic comfort measures Outcome: Progressing   Problem: Health Behavior/Discharge Planning: Goal: Ability to manage health-related needs will improve Outcome: Progressing   Problem: Clinical Measurements: Goal: Ability to maintain clinical measurements within normal limits will improve Outcome: Progressing Goal: Will remain free from infection Outcome: Progressing Goal: Diagnostic test results will improve Outcome: Progressing Goal: Respiratory complications will improve Outcome: Progressing Goal: Cardiovascular complication will be avoided Outcome: Progressing   Problem: Nutrition: Goal: Adequate nutrition will be maintained Outcome: Progressing   Problem: Coping: Goal: Level of anxiety will decrease Outcome: Progressing   Problem: Elimination: Goal: Will not experience complications related to bowel motility Outcome: Progressing Goal: Will not experience complications related to urinary retention Outcome: Progressing   Problem: Pain Managment: Goal: General experience of comfort will improve Outcome: Progressing   Problem: Safety: Goal: Ability to remain free from injury will improve Outcome: Progressing   Problem: Skin Integrity: Goal: Risk for impaired skin integrity will decrease Outcome: Progressing   Problem: Education: Goal: Verbalization of understanding the information provided (i.e., activity precautions, restrictions, etc) will improve Outcome: Progressing Goal: Individualized Educational Video(s) Outcome: Progressing   Problem: Clinical Measurements: Goal: Postoperative complications will be avoided or minimized Outcome: Progressing   Problem: Activity: Goal: Ability to ambulate and perform ADLs will improve Outcome: Progressing   Problem: Pain Management: Goal: Pain level will  decrease Outcome: Progressing

## 2020-08-31 NOTE — Progress Notes (Signed)
Physical Therapy Treatment Patient Details Name: Susan Kim MRN: [id number] DOB: June 03, 1950 Today's Date: 08/31/2020    History of Present Illness Per MD Notes: Susan Kim is a 70 y.o. female with a past medical history of fibromyalgia and hypertension who presents via EMS from home for assessment of right hip pain after patient states she slipped on a rug falling onto her right hip. Imaging revealed Right intertrochanteric hip fracture. Pt now s/p IM nailing. To be WBAT on RLE.    PT Comments    Pt was long sitting in bed on phone upon arriving. She agrees to PT session requesting to have BM/ required min assist to exit bed. Stood to [employer] with CGA from recliner/BSC/and EOB. Had successful small BM prior to ambulating. Pt becomes nauseous with ambulation due to pain. She was repositioned in recliner at conclusion of session with RN aware of session limitations. Plan is for pt to DC to rehab this afternoon.   Follow Up Recommendations  SNF;Supervision/Assistance - 24 hour;Supervision for mobility/OOB     Equipment Recommendations  Other (comment) (defer to next level of care)       Precautions / Restrictions Precautions Precautions: Fall Restrictions Weight Bearing Restrictions: Yes RLE Weight Bearing: Weight bearing as tolerated    Mobility  Bed Mobility Overal bed mobility: Needs Assistance Bed Mobility: Supine to Sit     Supine to sit: Min assist     General bed mobility comments: Min assist to exit L side of bed with vcs for improved technique throughout    Transfers Overall transfer level: Needs assistance Equipment used: Rolling walker (2 wheeled) Transfers: Sit to/from Stand Sit to Stand: Min guard         General transfer comment: CGA to stand from EOB/recliner/ and BSC during session  Ambulation/Gait Ambulation/Gait assistance: Supervision Gait Distance (Feet): 20 Feet Assistive device: Rolling walker (2 wheeled) Gait Pattern/deviations: Step-to  pattern;Decreased step length - left;Decreased stance time - right;Decreased stride length Gait velocity: decreased   General Gait Details: pt severely limited by pain and nausea. Ambulated 10 ft prior to requesting to return to recliner due to nausea       Balance Overall balance assessment: Needs assistance Sitting-balance support: Feet supported Sitting balance-Leahy Scale: Good     Standing balance support: During functional activity;Bilateral upper extremity supported Standing balance-Leahy Scale: Fair Standing balance comment: reliant on UE support during dynamic activity        Cognition Arousal/Alertness: Awake/alert Behavior During Therapy: WFL for tasks assessed/performed Overall Cognitive Status: Within Functional Limits for tasks assessed        General Comments: Pt is A and O x 4. Cooperative and pleasant             Pertinent Vitals/Pain Pain Assessment: 0-10 Pain Score: 6  Faces Pain Scale: Hurts little more Pain Location: RLE Pain Descriptors / Indicators: Sore;Grimacing;Guarding Pain Intervention(s): Limited activity within patient's tolerance;Monitored during session;Premedicated before session;Repositioned     PT Goals (current goals can now be found in the care plan section) Acute Rehab PT Goals Patient Stated Goal: pain under control and move better Progress towards PT goals: Progressing toward goals    Frequency    BID      PT Plan Current plan remains appropriate       AM-PAC PT "6 Clicks" Mobility   Outcome Measure  Help needed turning from your back to your side while in a flat bed without using bedrails?: A Little Help needed moving from lying on  your back to sitting on the side of a flat bed without using bedrails?: A Little Help needed moving to and from a bed to a chair (including a wheelchair)?: A Little Help needed standing up from a chair using your arms (e.g., wheelchair or bedside chair)?: A Little Help needed to walk  in hospital room?: A Little Help needed climbing 3-5 steps with a railing? : A Little 6 Click Score: 18    End of Session Equipment Utilized During Treatment: Gait belt Activity Tolerance: Patient tolerated treatment well Patient left: in chair;with call bell/phone within reach;with chair alarm set Nurse Communication: Mobility status PT Visit Diagnosis: Unsteadiness on feet (R26.81);Other abnormalities of gait and mobility (R26.89);Muscle weakness (generalized) (M62.81);History of falling (Z91.81);Pain;Difficulty in walking, not elsewhere classified (R26.2) Pain - Right/Left: Right Pain - part of body: Hip     Time: 1000-1020 PT Time Calculation (min) (ACUTE ONLY): 20 min  Charges:  $Therapeutic Activity: 8-22 mins                     Julaine Fusi PTA 08/31/20, 10:49 AM

## 2020-08-31 NOTE — Discharge Summary (Signed)
Susan Kim DQQ:[id number] DOB: 07-Nov-1950 DOA: 08/23/2020  PCP: Conni Elliot, MD  Admit date: 08/23/2020 Discharge date: 08/31/2020  Time spent: 35 minutes  Recommendations for Outpatient Follow-up:  [facility] orthopedic clinic f/u in 1 week (around July 1st)    Discharge Diagnoses:  Active Problems:   Closed right hip fracture, initial encounter [facility]   Essential hypertension   Insomnia   Discharge Condition: fair  Diet recommendation: regular  Filed Weights   08/23/20 1152 08/24/20 0510 08/24/20 0736  Weight: 79.4 kg 83.8 kg 83.8 kg    History of present illness:  Susan Kim is a 70 y.o. female with medical history significant for depression, generalized anxiety disorder, insomnia, constipation, migraine headaches, GERD, hypertension, presents to the emergency department for chief concerns of fall.   Patient tripped on her rug that was overturned and fell on her right hip.  She denies loss of consciousness, head trauma, syncope, neck or head pain.  Patient reports that the pain initially was a 10 out of 10 and persistent since her fall.  After pain medication, she states that the pain is 8 out of 10.   Prior to falling she denies chest pain, shortness of breath, abdominal pain, vision changes, weakness.   Social history: She is currently retired, formerly worked as an Therapist, sports. She denies tobacco use. Endorses infrequent etoh and denies recreational drug use.  Hospital Course:   Right intertrochanteric fracture: secondary to mechanical fall at home. Oxycodone, morphine prn for pain. S/p intramedullary nailing of right femur on 6/17. Weight bearing as tolerated to RLE as per ortho surg. OT/PT recs SNF.  Will need to f/u with [facility] in 2 weeks. Ortho has also prescribed 2 more weeks lovenox for dvt ppx   Normocytic anemia: w/ likely component of acute blood loss anemia likely secondary to above surg. H&H are statble. Start ediron   Orthostatic  hypotension: holding HCTZ.    Depression: Continue on home dose of bupropion, duloxetine   HTN: will continue to hold HCTZ as BP is on low end of normal   GERD: continue on PPI, H2 blocker   Chronic constipation: Continue on senokot, miralax. Stooling normally on opioids.   Insomnia: continue on home dose of melatonin  Procedures: Right femur operative repair 6/17   Consultations: orthopedics  Discharge Exam: Vitals:   08/31/20 0509 08/31/20 0821  BP: 114/68 133/64  Pulse: 89 89  Resp: 17 16  Temp: 98 F (36.7 C) 97.9 F (36.6 C)  SpO2: 98% 99%    General exam: Appears comfortable Respiratory system: clear breath sounds b/l Cardiovascular system: S1 &S2+. No rubs or clicks Gastrointestinal system: Abd is soft, NT, ND & normal bowel sounds Central nervous system: Alert and oriented. Moves all extremities  Discharge Instructions   Discharge Instructions     Diet - low sodium heart healthy   Complete by: As directed    Increase activity slowly   Complete by: As directed    Leave dressing on - Keep it clean, dry, and intact until clinic visit   Complete by: As directed       Allergies as of 08/31/2020       Reactions   Erythromycin Base Nausea And Vomiting   Hydrocodone-acetaminophen Nausea And Vomiting, Nausea Only   Other Rash   Silk tape-paper tape OK-slik tape burns and blisters        Medication List     STOP taking these medications    hydrochlorothiazide 25 MG tablet Commonly known  as: HYDRODIURIL   meloxicam 15 MG tablet Commonly known as: MOBIC       TAKE these medications    albuterol 108 (90 Base) MCG/ACT inhaler Commonly known as: VENTOLIN HFA Inhale 2 puffs into the lungs every 6 (six) hours as needed.   buPROPion 300 MG 24 hr tablet Commonly known as: WELLBUTRIN XL Take 1 tablet by mouth in the morning.   DSS 100 MG Caps Take 2 capsules by mouth every evening.   DULoxetine 60 MG capsule Commonly known as: CYMBALTA Take  60 mg by mouth daily.   enoxaparin 40 MG/0.4ML injection Commonly known as: LOVENOX Inject 0.4 mLs (40 mg total) into the skin daily for 14 days.   esomeprazole 40 MG capsule Commonly known as: NEXIUM Take 40 mg by mouth every morning.   ferrous sulfate 325 (65 FE) MG tablet Take 1 tablet (325 mg total) by mouth every other day. Start taking on: September 01, 2020   Fish Oil 1000 MG Caps Take 1 capsule by mouth daily.   loratadine 10 MG tablet Commonly known as: CLARITIN Take 10 mg by mouth daily.   melatonin 1 MG Tabs tablet Take 1 mg by mouth at bedtime.   methocarbamol 500 MG tablet Commonly known as: ROBAXIN Take 500 mg by mouth as needed.   ondansetron 4 MG tablet Commonly known as: ZOFRAN Take 4 mg by mouth once.   oxyCODONE 5 MG immediate release tablet Commonly known as: Oxy IR/ROXICODONE Take 0.5-1 tablets (2.5-5 mg total) by mouth every 4 (four) hours as needed for moderate pain (pain score 4-6).   Oyster Shell Calcium/D 250-125 MG-UNIT Tabs Take 1 tablet by mouth daily.   polyethylene glycol powder 17 GM/SCOOP powder Commonly known as: GLYCOLAX/MIRALAX Take 17 g by mouth daily as needed.   SUMAtriptan 25 MG tablet Commonly known as: IMITREX Take 12.5-25 mg by mouth once.   traMADol 50 MG tablet Commonly known as: ULTRAM Take 1 tablet (50 mg total) by mouth every 6 (six) hours as needed for moderate pain.   Turmeric Curcumin Caps Take 1 capsule by mouth daily.   vitamin B-12 500 MCG tablet Commonly known as: CYANOCOBALAMIN Take 500 mcg by mouth daily.   vitamin D3 50 MCG (2000 UT) Caps Take 1 capsule by mouth daily.               Discharge Care Instructions  (From admission, onward)           Start     Ordered   08/31/20 0000  Leave dressing on - Keep it clean, dry, and intact until clinic visit        08/31/20 0911           Allergies  Allergen Reactions   Erythromycin Base Nausea And Vomiting   Hydrocodone-Acetaminophen  Nausea And Vomiting and Nausea Only   Other Rash    Silk tape-paper tape OK-slik tape burns and blisters    Contact information for follow-up providers     Reche Dixon, PA-C Follow up in 2 week(s).   Specialty: Orthopedic Surgery Why: For x-rays of the right femur and staple removal Contact information: [address] [place] [address] [phone]              Contact information for after-discharge care     [facility] .   Service: Skilled Nursing Contact information: [address] [place]  [phone]                      The results of significant diagnostics from this hospitalization (including imaging, microbiology, ancillary and laboratory) are listed below for reference.    Significant Diagnostic Studies: DG Chest 1 View  Result Date: 08/23/2020 CLINICAL DATA:  RIGHT hip fracture, fell today EXAM: CHEST  1 VIEW COMPARISON:  None FINDINGS: Upper normal heart size. Mediastinal contours and pulmonary vascularity normal. Calcified granulomata in both lungs with calcified lymph nodes at LEFT hilum. Lungs otherwise clear. No acute infiltrate, pleural effusion, or pneumothorax. Bones appear demineralized. IMPRESSION: Old granulomatous disease. No acute abnormalities. Electronically Signed   By: Lavonia Dana M.D.   On: 08/23/2020 14:08   DG HIP OPERATIVE UNILAT W OR W/O PELVIS RIGHT  Result Date: 08/24/2020 CLINICAL DATA:  Intramedullary nail fixation of right femoral neck EXAM: OPERATIVE RIGHT HIP (WITH PELVIS IF PERFORMED) 2 VIEWS TECHNIQUE: Fluoroscopic spot image(s) were submitted for interpretation post-operatively. FLUOROSCOPY TIME:  2:09 COMPARISON:  Hip radiographs, 08/23/2020 FINDINGS: Intraoperative fluoroscopic images demonstrate intramedullary nail fixation of intratrochanteric fractures of the right hip. IMPRESSION: Intraoperative fluoroscopic images  demonstrate intramedullary nail fixation of intratrochanteric fractures of the right hip. Electronically Signed   By: Eddie Candle M.D.   On: 08/24/2020 11:09   DG Hip Unilat W or Wo Pelvis 2-3 Views Right  Result Date: 08/23/2020 CLINICAL DATA:  Right hip pain after a fall today. EXAM: DG HIP (WITH OR WITHOUT PELVIS) 2-3V RIGHT COMPARISON:  None. FINDINGS: The patient has an acute right intertrochanteric fracture. The fracture is mildly comminuted with the lesser trochanter a separate fragment. No other acute abnormality. IMPRESSION: Acute right intertrochanteric fracture. Electronically Signed   By: Inge Rise M.D.   On: 08/23/2020 14:07    Microbiology: Recent Results (from the past 240 hour(s))  Resp Panel by RT-PCR (Flu A&B, Covid) Nasopharyngeal Swab     Status: None   Collection Time: 08/23/20 11:56 AM   Specimen: Nasopharyngeal Swab; Nasopharyngeal(NP) swabs in vial transport medium  Result Value Ref Range Status   SARS Coronavirus 2 by RT PCR NEGATIVE NEGATIVE Final    Comment: (NOTE) SARS-CoV-2 target nucleic acids are NOT DETECTED.  The SARS-CoV-2 RNA is generally detectable in upper respiratory specimens during the acute phase of infection. The lowest concentration of SARS-CoV-2 viral copies this assay can detect is 138 copies/mL. A negative result does not preclude SARS-Cov-2 infection and should not be used as the sole basis for treatment or other patient management decisions. A negative result may occur with  improper specimen collection/handling, submission of specimen other than nasopharyngeal swab, presence of viral mutation(s) within the areas targeted by this assay, and inadequate number of viral copies(<138 copies/mL). A negative result must be combined with clinical observations, patient history, and epidemiological information. The expected result is Negative.  Fact Sheet for Patients:  EntrepreneurPulse.com.au  Fact Sheet for Healthcare  Providers:  IncredibleEmployment.be  This test is no t yet approved or cleared by the [place] FDA and  has been authorized for detection and/or diagnosis of SARS-CoV-2 by FDA under an Emergency Use Authorization (EUA). This EUA will remain  in effect (meaning this test can be used) for the duration of the COVID-19 declaration under Section 564(b)(1) of the Act, 21 U.S.C.section 360bbb-3(b)(1), unless the authorization is terminated  or revoked sooner.       Influenza A by PCR NEGATIVE NEGATIVE Final   Influenza B by PCR NEGATIVE NEGATIVE  Final    Comment: (NOTE) The Xpert Xpress SARS-CoV-2/FLU/RSV plus assay is intended as an aid in the diagnosis of influenza from Nasopharyngeal swab specimens and should not be used as a sole basis for treatment. Nasal washings and aspirates are unacceptable for Xpert Xpress SARS-CoV-2/FLU/RSV testing.  Fact Sheet for Patients: EntrepreneurPulse.com.au  Fact Sheet for Healthcare Providers: IncredibleEmployment.be  This test is not yet approved or cleared by the [place] FDA and has been authorized for detection and/or diagnosis of SARS-CoV-2 by FDA under an Emergency Use Authorization (EUA). This EUA will remain in effect (meaning this test can be used) for the duration of the COVID-19 declaration under Section 564(b)(1) of the Act, 21 U.S.C. section 360bbb-3(b)(1), unless the authorization is terminated or revoked.  Performed at [facility], [address], [place], [place] [address]   Surgical pcr screen     Status: None   Collection Time: 08/23/20  6:44 PM   Specimen: Nasal Mucosa; Nasal Swab  Result Value Ref Range Status   MRSA, PCR NEGATIVE NEGATIVE Final   Staphylococcus aureus NEGATIVE NEGATIVE Final    Comment: (NOTE) The Xpert SA Assay (FDA approved for NASAL specimens in patients 45 years of age and older), is one component of a  comprehensive surveillance program. It is not intended to diagnose infection nor to guide or monitor treatment. Performed at [facility], [address] [place], [place] [address]   Resp Panel by RT-PCR (Flu A&B, Covid) Nasopharyngeal Swab     Status: None   Collection Time: 08/30/20  5:23 PM   Specimen: Nasopharyngeal Swab; Nasopharyngeal(NP) swabs in vial transport medium  Result Value Ref Range Status   SARS Coronavirus 2 by RT PCR NEGATIVE NEGATIVE Final    Comment: (NOTE) SARS-CoV-2 target nucleic acids are NOT DETECTED.  The SARS-CoV-2 RNA is generally detectable in upper respiratory specimens during the acute phase of infection. The lowest concentration of SARS-CoV-2 viral copies this assay can detect is 138 copies/mL. A negative result does not preclude SARS-Cov-2 infection and should not be used as the sole basis for treatment or other patient management decisions. A negative result may occur with  improper specimen collection/handling, submission of specimen other than nasopharyngeal swab, presence of viral mutation(s) within the areas targeted by this assay, and inadequate number of viral copies(<138 copies/mL). A negative result must be combined with clinical observations, patient history, and epidemiological information. The expected result is Negative.  Fact Sheet for Patients:  EntrepreneurPulse.com.au  Fact Sheet for Healthcare Providers:  IncredibleEmployment.be  This test is no t yet approved or cleared by the [place] FDA and  has been authorized for detection and/or diagnosis of SARS-CoV-2 by FDA under an Emergency Use Authorization (EUA). This EUA will remain  in effect (meaning this test can be used) for the duration of the COVID-19 declaration under Section 564(b)(1) of the Act, 21 U.S.C.section 360bbb-3(b)(1), unless the authorization is terminated  or revoked sooner.       Influenza A by PCR  NEGATIVE NEGATIVE Final   Influenza B by PCR NEGATIVE NEGATIVE Final    Comment: (NOTE) The Xpert Xpress SARS-CoV-2/FLU/RSV plus assay is intended as an aid in the diagnosis of influenza from Nasopharyngeal swab specimens and should not be used as a sole basis for treatment. Nasal washings and aspirates are unacceptable for Xpert Xpress SARS-CoV-2/FLU/RSV testing.  Fact Sheet for Patients: EntrepreneurPulse.com.au  Fact Sheet for Healthcare Providers: IncredibleEmployment.be  This test is not yet approved or cleared by the [place]  States FDA and has been authorized for detection and/or diagnosis of SARS-CoV-2 by FDA under an Emergency Use Authorization (EUA). This EUA will remain in effect (meaning this test can be used) for the duration of the COVID-19 declaration under Section 564(b)(1) of the Act, 21 U.S.C. section 360bbb-3(b)(1), unless the authorization is terminated or revoked.  Performed at [facility], [address] [place], [place] [address]      Labs: Basic Metabolic Panel: Recent Labs  Lab 08/26/20 0503 08/27/20 0445 08/28/20 0605 08/29/20 0505 08/30/20 0433  NA 136 135 138 138 139  K 3.6 3.6 4.2 4.0 3.8  CL 105 101 105 106 107  CO2 27 28 28 27 26   GLUCOSE 112* 131* 114* 109* 109*  BUN 20 19 16 15 21   CREATININE 0.57 0.58 0.53 0.58 0.47  CALCIUM 8.0* 8.3* 8.2* 8.1* 8.4*   Liver Function Tests: No results for input(s): AST, ALT, ALKPHOS, BILITOT, PROT, ALBUMIN in the last 168 hours. No results for input(s): LIPASE, AMYLASE in the last 168 hours. No results for input(s): AMMONIA in the last 168 hours. CBC: Recent Labs  Lab 08/26/20 0503 08/27/20 0445 08/28/20 0605 08/29/20 0505 08/30/20 0433  WBC 5.4 5.1 4.5 4.4 4.3  HGB 8.2* 8.5* 8.4* 8.6* 8.7*  HCT 24.1* 24.9* 23.7* 25.9* 26.2*  MCV 97.2 97.3 95.6 96.3 97.8  PLT 172 205 251 294 355   Cardiac Enzymes: No results for input(s): CKTOTAL, CKMB,  CKMBINDEX, TROPONINI in the last 168 hours. BNP: BNP (last 3 results) No results for input(s): BNP in the last 8760 hours.  ProBNP (last 3 results) No results for input(s): PROBNP in the last 8760 hours.  CBG: No results for input(s): GLUCAP in the last 168 hours.     Signed:  Desma Maxim MD.  [facility] 08/31/2020, 9:11 AM

## 2020-08-31 NOTE — Care Management Important Message (Signed)
Important Message  Patient Details  Name: Susan Kim MRN: [id number] Date of Birth: 01-16-1951   Medicare Important Message Given:  Yes     Dannette Barbara 08/31/2020, 10:47 AM

## 2020-08-31 NOTE — TOC Progression Note (Signed)
Transition of Care [facility]) - Progression Note    Patient Details  Name: Susan Kim MRN: [id number] Date of Birth: 1950-08-14  Transition of Care [facility]) CM/SW [place], RN Phone Number: 08/31/2020, 9:48 AM  Clinical Narrative:     The patient is to transport to [facility] to be picked up by [facility] at 330 PM, she has had Covid booster and has her card with her The DC packet is on the chart, she will alert her son.  Expected Discharge Plan: [place] Barriers to Discharge: Barriers Resolved  Expected Discharge Plan and Services Expected Discharge Plan: [place]   Discharge Planning Services: CM Consult   Living arrangements for the past 2 months: Single Family Home Expected Discharge Date: 08/31/20                 DME Agency: [employer] Date DME Agency Contacted: 08/29/20 Time DME Agency Contacted: 0626 Representative spoke with at DME Agency: [place]: PT [place]: [facility] (now [facility] Date [place]: 08/29/20 Time [place]: 59 Representative spoke with at [place]: [place]   Social Determinants of Health ([place]) Interventions    Readmission Risk Interventions No flowsheet data found.

## 2020-08-31 NOTE — Progress Notes (Signed)
Occupational Therapy Treatment Patient Details Name: Susan Kim MRN: [id number] DOB: 23-Jan-1951 Today's Date: 08/31/2020    History of present illness Per MD Notes: Cathyann Kilfoyle is a 70 y.o. female with a past medical history of fibromyalgia and hypertension who presents via EMS from home for assessment of right hip pain after patient states she slipped on a rug falling onto her right hip. Imaging revealed Right intertrochanteric hip fracture. Pt now s/p IM nailing. To be WBAT on RLE.   OT comments  Ms Kulig was seen for OT treatment on this date. Upon arrival to room pt reclined in bed eager for OOB to toilet for BM. MAX A for LBD at bed level. CGA + RW for toilet t/f, BSC over toilet for elveated seat and rails and perihygiene in standing. CGA + RW tooth brushing standing sink side.   Pt requires return to bed 2/2 fatigue following ~2 in standing citing 6/10 R hip pain. RN in room to deliver pain meds. Pt left in bed. Pt making good progress toward goals. Pt continues to benefit from skilled OT services to maximize return to PLOF and minimize risk of future falls, injury, caregiver burden, and readmission. Will continue to follow POC. Discharge recommendation remains appropriate.    Follow Up Recommendations  SNF;Supervision - Intermittent    Equipment Recommendations  3 in 1 bedside commode    Recommendations for Other Services      Precautions / Restrictions Precautions Precautions: Fall Restrictions Weight Bearing Restrictions: Yes RLE Weight Bearing: Weight bearing as tolerated       Mobility Bed Mobility Overal bed mobility: Needs Assistance Bed Mobility: Supine to Sit;Sit to Supine     Supine to sit: Min assist Sit to supine: Mod assist      Transfers Overall transfer level: Needs assistance Equipment used: Rolling walker (2 wheeled) Transfers: Sit to/from Stand Sit to Stand: Min guard          Balance Overall balance assessment: Needs  assistance Sitting-balance support: Feet supported Sitting balance-Leahy Scale: Good     Standing balance support: No upper extremity supported;During functional activity Standing balance-Leahy Scale: Fair Standing balance comment: abdominal support on counter with intermittnet single UE support                           ADL either performed or assessed with clinical judgement   ADL Overall ADL's : Needs assistance/impaired                                       General ADL Comments: MAX A for LBD at bed level. CGA + RW for toilet t/f, BSC over toilet for elveated seat and rails and perihygiene in standing. CGA + RW tooth brushing standing sink side. Pt requires return to bed 2/2 fatigue following ~2 in standing      Cognition Arousal/Alertness: Awake/alert Behavior During Therapy: WFL for tasks assessed/performed Overall Cognitive Status: Within Functional Limits for tasks assessed                                 General Comments: Pt is A and O x 4. Cooperative and pleasant        Exercises Exercises: Other exercises Other Exercises Other Exercises: Pt educated re: HEP, pain mgmt, ECS, home/routines modifications Other Exercises: LBD,  toileting, tooth brushing, sup<>sit, sit<>stand x2, sitting/standing balance/tolerance           Pertinent Vitals/ Pain       Pain Assessment: 0-10 Pain Score: 6  Faces Pain Scale: Hurts little more Pain Location: RLE Pain Descriptors / Indicators: Sore;Grimacing;Guarding Pain Intervention(s): Limited activity within patient's tolerance;Premedicated before session;Patient requesting pain meds-RN notified;RN gave pain meds during session   Frequency  Min 2X/week        Progress Toward Goals  OT Goals(current goals can now be found in the care plan section)  Progress towards OT goals: Progressing toward goals  Acute Rehab OT Goals Patient Stated Goal: pain under control and move  better OT Goal Formulation: With patient Time For Goal Achievement: 09/07/20 Potential to Achieve Goals: Good ADL Goals Pt Will Perform Grooming: with modified independence;standing Pt Will Perform Lower Body Dressing: sit to/from stand;with modified independence;with adaptive equipment Pt Will Transfer to Toilet: bedside commode;with modified independence;ambulating Pt Will Perform Toileting - Clothing Manipulation and hygiene: sit to/from stand;with modified independence;with adaptive equipment  Plan Discharge plan remains appropriate;Frequency remains appropriate       AM-PAC OT "6 Clicks" Daily Activity     Outcome Measure   Help from another person eating meals?: None Help from another person taking care of personal grooming?: A Little Help from another person toileting, which includes using toliet, bedpan, or urinal?: A Lot Help from another person bathing (including washing, rinsing, drying)?: A Lot Help from another person to put on and taking off regular upper body clothing?: A Little Help from another person to put on and taking off regular lower body clothing?: A Lot 6 Click Score: 16    End of Session Equipment Utilized During Treatment: Rolling walker  OT Visit Diagnosis: Other abnormalities of gait and mobility (R26.89);Pain Pain - Right/Left: Right Pain - part of body: Hip   Activity Tolerance Patient tolerated treatment well   Patient Left in bed;with call bell/phone within reach;with bed alarm set;with nursing/sitter in room   Nurse Communication Mobility status        Time: 1007-1219 OT Time Calculation (min): 26 min  Charges: OT General Charges $OT Visit: 1 Visit OT Treatments $Self Care/Home Management : 23-37 mins  Dessie Coma, M.S. OTR/L  08/31/20, 1:15 PM  ascom [phone]

## 2020-10-17 ENCOUNTER — Encounter: Payer: Self-pay | Admitting: Orthopedic Surgery

## 2020-12-06 ENCOUNTER — Ambulatory Visit: Payer: Medicare PPO | Admitting: Dermatology

## 2020-12-06 ENCOUNTER — Other Ambulatory Visit: Payer: Self-pay

## 2020-12-06 DIAGNOSIS — L820 Inflamed seborrheic keratosis: Secondary | ICD-10-CM | POA: Diagnosis not present

## 2020-12-06 DIAGNOSIS — L720 Epidermal cyst: Secondary | ICD-10-CM

## 2020-12-06 DIAGNOSIS — L578 Other skin changes due to chronic exposure to nonionizing radiation: Secondary | ICD-10-CM

## 2020-12-06 DIAGNOSIS — D229 Melanocytic nevi, unspecified: Secondary | ICD-10-CM

## 2020-12-06 DIAGNOSIS — L853 Xerosis cutis: Secondary | ICD-10-CM | POA: Diagnosis not present

## 2020-12-06 DIAGNOSIS — L814 Other melanin hyperpigmentation: Secondary | ICD-10-CM

## 2020-12-06 DIAGNOSIS — Z1283 Encounter for screening for malignant neoplasm of skin: Secondary | ICD-10-CM

## 2020-12-06 DIAGNOSIS — D1800 Hemangioma unspecified site: Secondary | ICD-10-CM

## 2020-12-06 DIAGNOSIS — L821 Other seborrheic keratosis: Secondary | ICD-10-CM

## 2020-12-06 NOTE — Progress Notes (Signed)
   Follow-Up Visit   Subjective  Susan Kim is a 70 y.o. female who presents for the following: Annual Exam (No history of skin cancer or abnormal moles - TBSE today). The patient presents for Total-Body Skin Exam (TBSE) for skin cancer screening and mole check.  The following portions of the chart were reviewed this encounter and updated as appropriate:   Tobacco  Allergies  Meds  Problems  Med Hx  Surg Hx  Fam Hx     Review of Systems:  No other skin or systemic complaints except as noted in HPI or Assessment and Plan.  Objective  Well appearing patient in no apparent distress; mood and affect are within normal limits.  A full examination was performed including scalp, head, eyes, ears, nose, lips, neck, chest, axillae, abdomen, back, buttocks, bilateral upper extremities, bilateral lower extremities, hands, feet, fingers, toes, fingernails, and toenails. All findings within normal limits unless otherwise noted below.  Xerosis  Left Zygomatic Area x 1, left popliteal x 1 (2) Erythematous keratotic or waxy stuck-on papule or plaque.   Right Breast 0.4 cm firm papule   Assessment & Plan   Lentigines - Scattered tan macules - Due to sun exposure - Benign-appearing, observe - Recommend daily broad spectrum sunscreen SPF 30+ to sun-exposed areas, reapply every 2 hours as needed. - Call for any changes  Seborrheic Keratoses - Stuck-on, waxy, tan-brown papules and/or plaques  - Benign-appearing - Discussed benign etiology and prognosis. - Observe - Call for any changes  Melanocytic Nevi - Tan-brown and/or pink-flesh-colored symmetric macules and papules - Benign appearing on exam today - Observation - Call clinic for new or changing moles - Recommend daily use of broad spectrum spf 30+ sunscreen to sun-exposed areas.   Hemangiomas - Red papules - Discussed benign nature - Observe - Call for any changes  Actinic Damage - Chronic condition, secondary to  cumulative UV/sun exposure - diffuse scaly erythematous macules with underlying dyspigmentation - Recommend daily broad spectrum sunscreen SPF 30+ to sun-exposed areas, reapply every 2 hours as needed.  - Staying in the shade or wearing long sleeves, sun glasses (UVA+UVB protection) and wide brim hats (4-inch brim around the entire circumference of the hat) are also recommended for sun protection.  - Call for new or changing lesions.  Skin cancer screening performed today.  Inflamed seborrheic keratosis Left Zygomatic Area x 1, left popliteal x 1  Destruction of lesion - Left Zygomatic Area x 1, left popliteal x 1 Complexity: simple   Destruction method: cryotherapy   Informed consent: discussed and consent obtained   Timeout:  patient name, date of birth, surgical site, and procedure verified Lesion destroyed using liquid nitrogen: Yes   Region frozen until ice ball extended beyond lesion: Yes   Outcome: patient tolerated procedure well with no complications   Post-procedure details: wound care instructions given    Epidermal inclusion cyst Right Breast Benign, observe.   Xerosis cutis Recommend moisturizer daily -CeraVe cream  Skin cancer screening  Return for 4-5 months , ISK follow up.  I, Ashok Cordia, CMA, am acting as scribe for Sarina Ser, MD . Documentation: I have reviewed the above documentation for accuracy and completeness, and I agree with the above.  Sarina Ser, MD

## 2020-12-06 NOTE — Patient Instructions (Signed)

## 2020-12-07 ENCOUNTER — Encounter: Payer: Self-pay | Admitting: Dermatology

## 2021-04-15 ENCOUNTER — Ambulatory Visit: Payer: Medicare PPO | Admitting: Dermatology

## 2021-12-12 ENCOUNTER — Ambulatory Visit: Payer: Medicare PPO | Admitting: Dermatology

## 2022-03-12 ENCOUNTER — Ambulatory Visit: Payer: Medicare PPO | Admitting: Dermatology

## 2022-03-12 DIAGNOSIS — L814 Other melanin hyperpigmentation: Secondary | ICD-10-CM | POA: Diagnosis not present

## 2022-03-12 DIAGNOSIS — L820 Inflamed seborrheic keratosis: Secondary | ICD-10-CM

## 2022-03-12 DIAGNOSIS — L578 Other skin changes due to chronic exposure to nonionizing radiation: Secondary | ICD-10-CM | POA: Diagnosis not present

## 2022-03-12 DIAGNOSIS — Z1283 Encounter for screening for malignant neoplasm of skin: Secondary | ICD-10-CM

## 2022-03-12 DIAGNOSIS — D229 Melanocytic nevi, unspecified: Secondary | ICD-10-CM

## 2022-03-12 DIAGNOSIS — L821 Other seborrheic keratosis: Secondary | ICD-10-CM

## 2022-03-12 NOTE — Patient Instructions (Signed)
Due to recent changes in healthcare laws, you may see results of your pathology and/or laboratory studies on MyChart before the doctors have had a chance to review them. We understand that in some cases there may be results that are confusing or concerning to you. Please understand that not all results are received at the same time and often the doctors may need to interpret multiple results in order to provide you with the best plan of care or course of treatment. Therefore, we ask that you please give us 2 business days to thoroughly review all your results before contacting the office for clarification. Should we see a critical lab result, you will be contacted sooner.   If You Need Anything After Your Visit  If you have any questions or concerns for your doctor, please call our main line at [phone] and press option 4 to reach your doctor's medical assistant. If no one answers, please leave a voicemail as directed and we will return your call as soon as possible. Messages left after 4 pm will be answered the following business day.   You may also send us a message via MyChart. We typically respond to MyChart messages within 1-2 business days.  For prescription refills, please ask your pharmacy to contact our office. Our fax number is [phone].  If you have an urgent issue when the clinic is closed that cannot wait until the next business day, you can page your doctor at the number below.    Please note that while we do our best to be available for urgent issues outside of office hours, we are not available 24/7.   If you have an urgent issue and are unable to reach us, you may choose to seek medical care at your doctor's office, retail clinic, urgent care center, or emergency room.  If you have a medical emergency, please immediately call 911 or go to the emergency department.  Pager Numbers  - Dr. Kowalski: [phone]  - Dr. Moye: [phone]  - Dr. Stewart:  [phone]  In the event of inclement weather, please call our main line at [phone] for an update on the status of any delays or closures.  Dermatology Medication Tips: Please keep the boxes that topical medications come in in order to help keep track of the instructions about where and how to use these. Pharmacies typically print the medication instructions only on the boxes and not directly on the medication tubes.   If your medication is too expensive, please contact our office at [phone] option 4 or send us a message through MyChart.   We are unable to tell what your co-pay for medications will be in advance as this is different depending on your insurance coverage. However, we may be able to find a substitute medication at lower cost or fill out paperwork to get insurance to cover a needed medication.   If a prior authorization is required to get your medication covered by your insurance company, please allow us 1-2 business days to complete this process.  Drug prices often vary depending on where the prescription is filled and some pharmacies may offer cheaper prices.  The website www.goodrx.com contains coupons for medications through different pharmacies. The prices here do not account for what the cost may be with help from insurance (it may be cheaper with your insurance), but the website can give you the price if you did not use any insurance.  - You can print the associated coupon and take it with   your prescription to the pharmacy.  - You may also stop by our office during regular business hours and pick up a GoodRx coupon card.  - If you need your prescription sent electronically to a different pharmacy, notify our office through [facility] MyChart or by phone at [phone] option 4.     Si Usted Necesita Algo Despus de Su Visita  Tambin puede enviarnos un mensaje a travs de MyChart. Por lo general respondemos a los mensajes de MyChart en el transcurso de 1 a 2  das hbiles.  Para renovar recetas, por favor pida a su farmacia que se ponga en contacto con nuestra oficina. Nuestro nmero de fax es el [phone].  Si tiene un asunto urgente cuando la clnica est cerrada y que no puede esperar hasta el siguiente da hbil, puede llamar/localizar a su doctor(a) al nmero que aparece a continuacin.   Por favor, tenga en cuenta que aunque hacemos todo lo posible para estar disponibles para asuntos urgentes fuera del horario de oficina, no estamos disponibles las 24 horas del da, los 7 das de la semana.   Si tiene un problema urgente y no puede comunicarse con nosotros, puede optar por buscar atencin mdica  en el consultorio de su doctor(a), en una clnica privada, en un centro de atencin urgente o en una sala de emergencias.  Si tiene una emergencia mdica, por favor llame inmediatamente al 911 o vaya a la sala de emergencias.  Nmeros de bper  - Dr. Kowalski: [phone]  - Dra. Moye: [phone]  - Dra. Stewart: [phone]  En caso de inclemencias del tiempo, por favor llame a nuestra lnea principal al [phone] para una actualizacin sobre el estado de cualquier retraso o cierre.  Consejos para la medicacin en dermatologa: Por favor, guarde las cajas en las que vienen los medicamentos de uso tpico para ayudarle a seguir las instrucciones sobre dnde y cmo usarlos. Las farmacias generalmente imprimen las instrucciones del medicamento slo en las cajas y no directamente en los tubos del medicamento.   Si su medicamento es muy caro, por favor, pngase en contacto con nuestra oficina llamando al [phone] y presione la opcin 4 o envenos un mensaje a travs de MyChart.   No podemos decirle cul ser su copago por los medicamentos por adelantado ya que esto es diferente dependiendo de la cobertura de su seguro. Sin embargo, es posible que podamos encontrar un medicamento sustituto a menor costo o llenar un formulario para que el  seguro cubra el medicamento que se considera necesario.   Si se requiere una autorizacin previa para que su compaa de seguros cubra su medicamento, por favor permtanos de 1 a 2 das hbiles para completar este proceso.  Los precios de los medicamentos varan con frecuencia dependiendo del lugar de dnde se surte la receta y alguna farmacias pueden ofrecer precios ms baratos.  El sitio web www.goodrx.com tiene cupones para medicamentos de diferentes farmacias. Los precios aqu no tienen en cuenta lo que podra costar con la ayuda del seguro (puede ser ms barato con su seguro), pero el sitio web puede darle el precio si no utiliz ningn seguro.  - Puede imprimir el cupn correspondiente y llevarlo con su receta a la farmacia.  - Tambin puede pasar por nuestra oficina durante el horario de atencin regular y recoger una tarjeta de cupones de GoodRx.  - Si necesita que su receta se enve electrnicamente a una farmacia diferente, informe a nuestra oficina a travs de MyChart de [facility]   o por telfono llamando al [phone] y presione la opcin 4.  

## 2022-03-12 NOTE — Progress Notes (Signed)
   Follow-Up Visit   Subjective  Susan Kim is a 72 y.o. female who presents for the following: Annual Exam. Hx of Seborrheic keratosis. The patient presents for Total-Body Skin Exam (TBSE) for skin cancer screening and mole check.  The patient has spots, moles and lesions to be evaluated, some may be new or changing and the patient has concerns that these could be cancer.   The following portions of the chart were reviewed this encounter and updated as appropriate:   Tobacco  Allergies  Meds  Problems  Med Hx  Surg Hx  Fam Hx     Review of Systems:  No other skin or systemic complaints except as noted in HPI or Assessment and Plan.  Objective  Well appearing patient in no apparent distress; mood and affect are within normal limits.  A full examination was performed including scalp, head, eyes, ears, nose, lips, neck, chest, axillae, abdomen, back, buttocks, bilateral upper extremities, bilateral lower extremities, hands, feet, fingers, toes, fingernails, and toenails. All findings within normal limits unless otherwise noted below.  face,neck x 7 (7) Stuck-on, waxy, tan-brown papules-- Discussed benign etiology and prognosis.    Assessment & Plan  Inflamed seborrheic keratosis (7) face,neck x 7 Symptomatic, irritating, patient would like treated.  Destruction of lesion - face,neck x 7 Complexity: simple   Destruction method: cryotherapy   Informed consent: discussed and consent obtained   Timeout:  patient name, date of birth, surgical site, and procedure verified Lesion destroyed using liquid nitrogen: Yes   Region frozen until ice ball extended beyond lesion: Yes   Outcome: patient tolerated procedure well with no complications   Post-procedure details: wound care instructions given    Lentigines - Scattered tan macules - Due to sun exposure - Benign-appearing, observe - Recommend daily broad spectrum sunscreen SPF 30+ to sun-exposed areas, reapply every 2 hours  as needed. - Call for any changes  Seborrheic Keratoses - Stuck-on, waxy, tan-brown papules and/or plaques  - Benign-appearing - Discussed benign etiology and prognosis. - Observe - Call for any changes  Melanocytic Nevi - Tan-brown and/or pink-flesh-colored symmetric macules and papules - Benign appearing on exam today - Observation - Call clinic for new or changing moles - Recommend daily use of broad spectrum spf 30+ sunscreen to sun-exposed areas.   Hemangiomas - Red papules - Discussed benign nature - Observe - Call for any changes  Actinic Damage - Chronic condition, secondary to cumulative UV/sun exposure - diffuse scaly erythematous macules with underlying dyspigmentation - Recommend daily broad spectrum sunscreen SPF 30+ to sun-exposed areas, reapply every 2 hours as needed.  - Staying in the shade or wearing long sleeves, sun glasses (UVA+UVB protection) and wide brim hats (4-inch brim around the entire circumference of the hat) are also recommended for sun protection.  - Call for new or changing lesions.  Skin cancer screening performed today.   Return in about 2 years (around 03/12/2024) for TBSE, hx of ISks .  IMarye Round, CMA, am acting as scribe for Sarina Ser, MD .  Documentation: I have reviewed the above documentation for accuracy and completeness, and I agree with the above.  Sarina Ser, MD

## 2022-03-14 ENCOUNTER — Encounter: Payer: Self-pay | Admitting: Dermatology

## 2022-08-31 IMAGING — CR DG HIP (WITH OR WITHOUT PELVIS) 2-3V*R*
1 series · 3 of 3 positions shown · non-contrast
Comparison: None.

CLINICAL DATA: Right hip pain after a fall today.

EXAM:
DG HIP (WITH OR WITHOUT PELVIS) 2-3V RIGHT

[Series 1: dg hip unilat w or w/o pelvis 2-3 views  · non-contrast · 0.14mm/px · 3 of 3 slices shown]
[im 1/3]
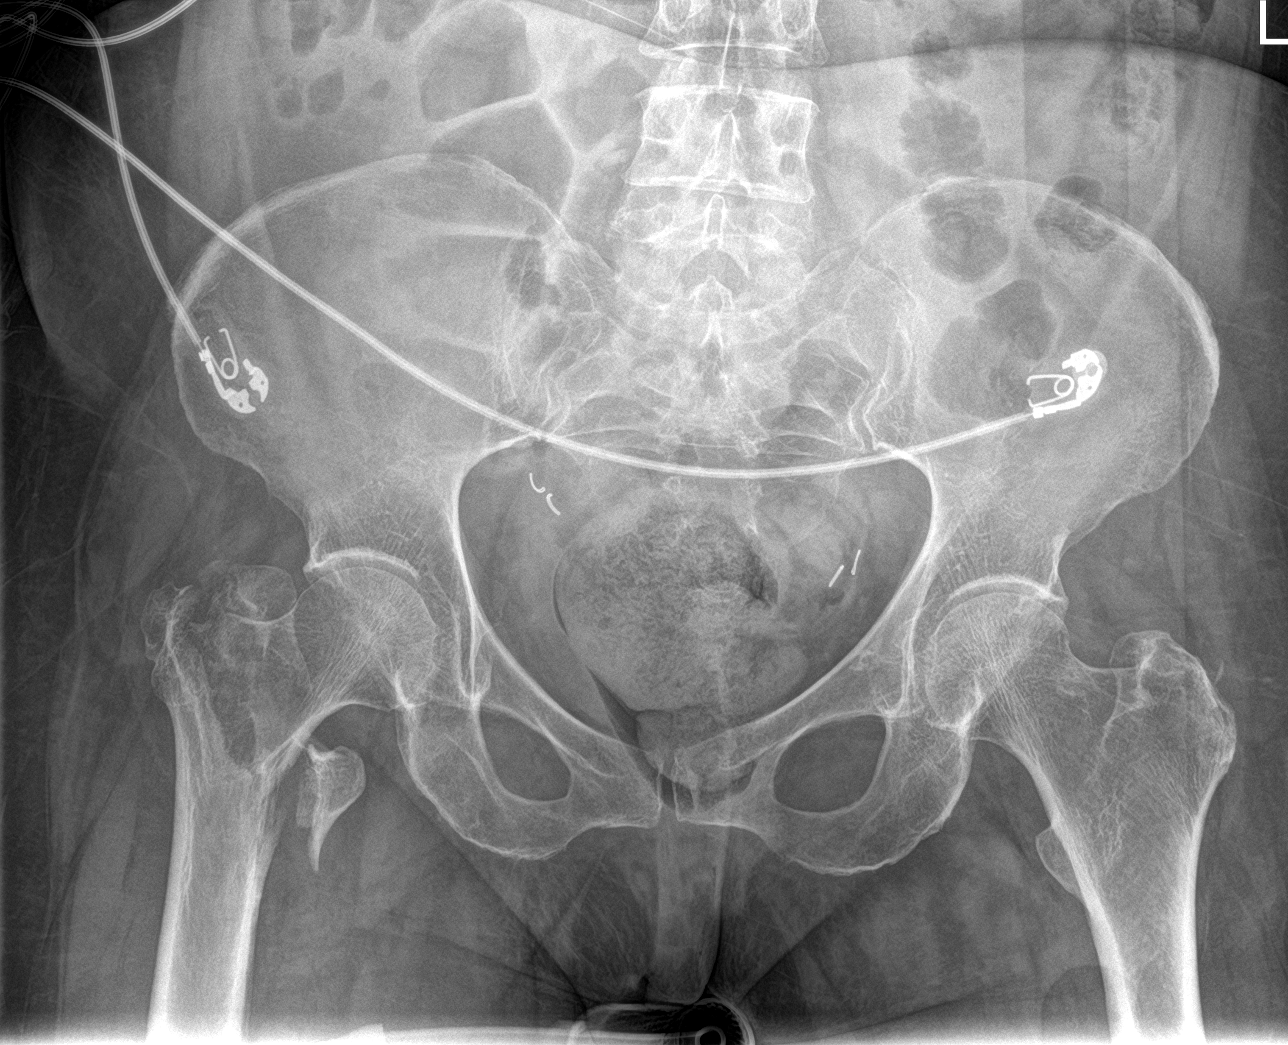
[im 2/3]
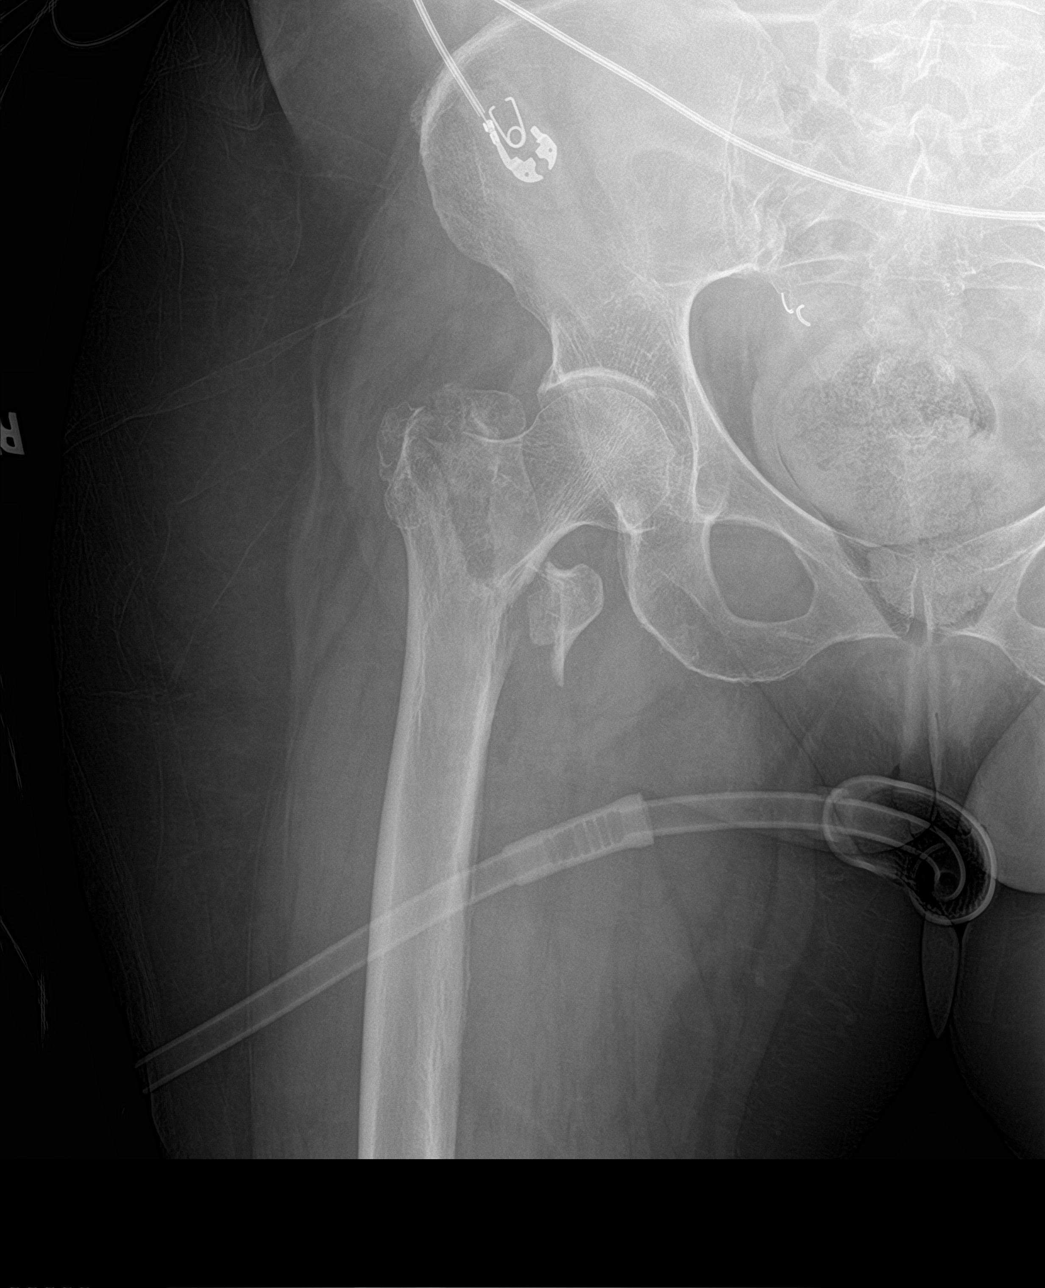
[im 3/3]
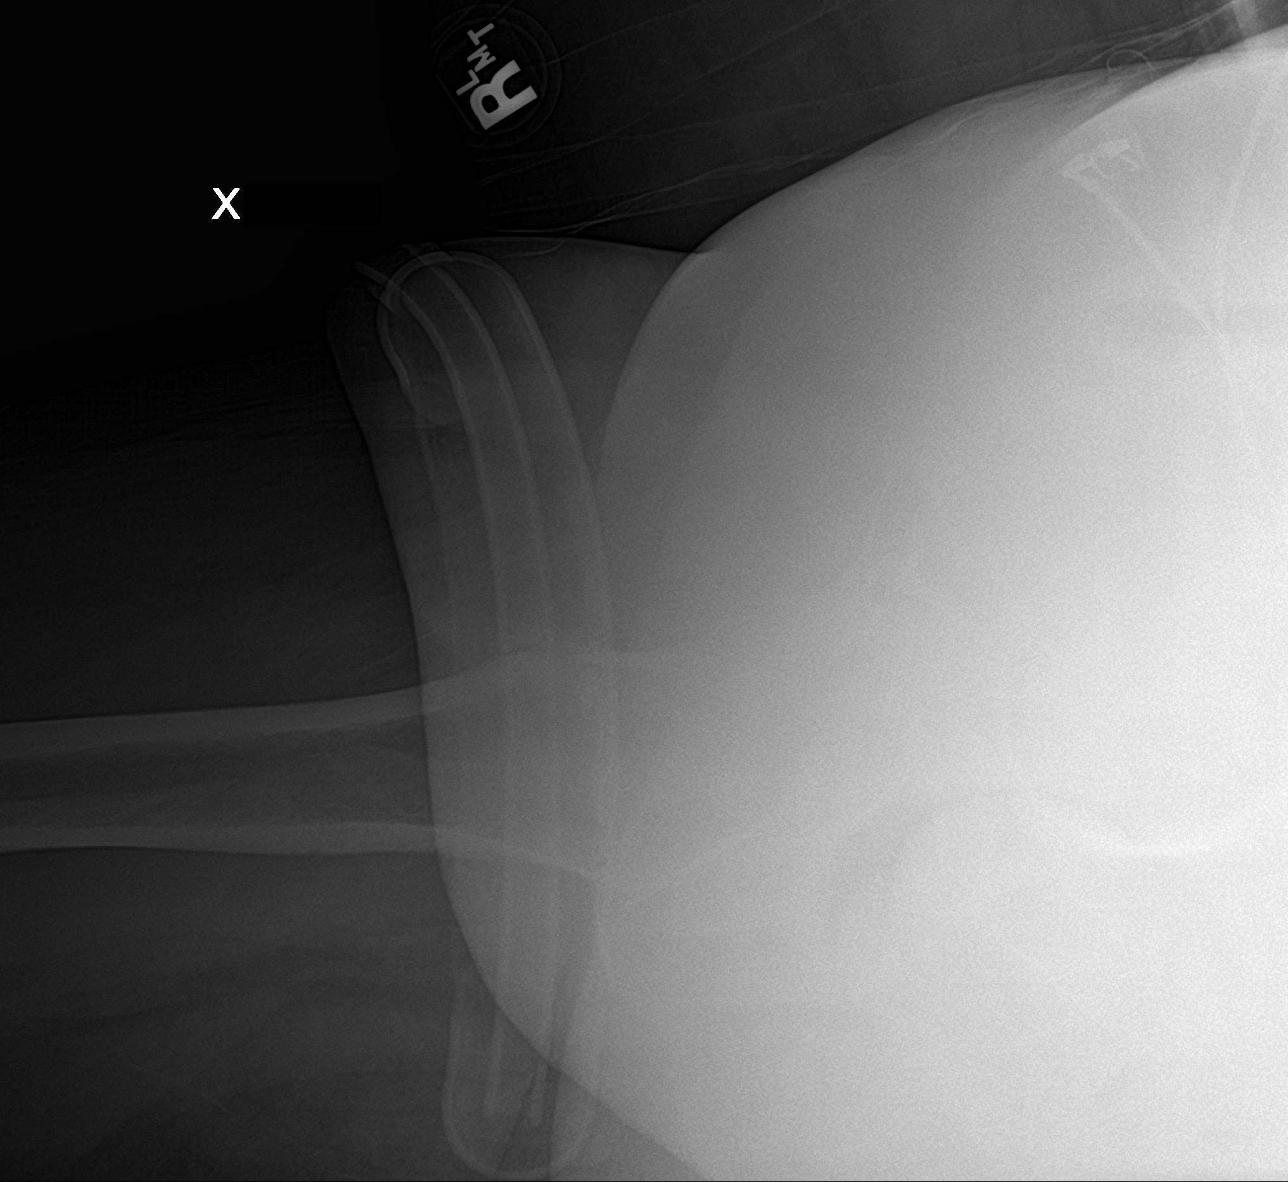

[3 of 3 positions shown; findings below may reference images not displayed]

FINDINGS: The patient has an acute right intertrochanteric fracture. The
fracture is mildly comminuted with the lesser trochanter a separate
fragment. No other acute abnormality.
IMPRESSION: Acute right intertrochanteric fracture.

## 2022-09-01 IMAGING — RF DG HIP (WITH PELVIS) OPERATIVE*R*
1 series · 11 of 11 positions shown · non-contrast
Comparison: Hip radiographs, 08/23/2020

CLINICAL DATA: Intramedullary nail fixation of right femoral neck

EXAM:
OPERATIVE RIGHT HIP (WITH PELVIS IF PERFORMED) 2 VIEWS
TECHNIQUE: Fluoroscopic spot image(s) were submitted for interpretation
post-operatively.
FLUOROSCOPY TIME:  [DATE]

[Series 1: run · 11 of 11 slices shown]
[im 1/11]
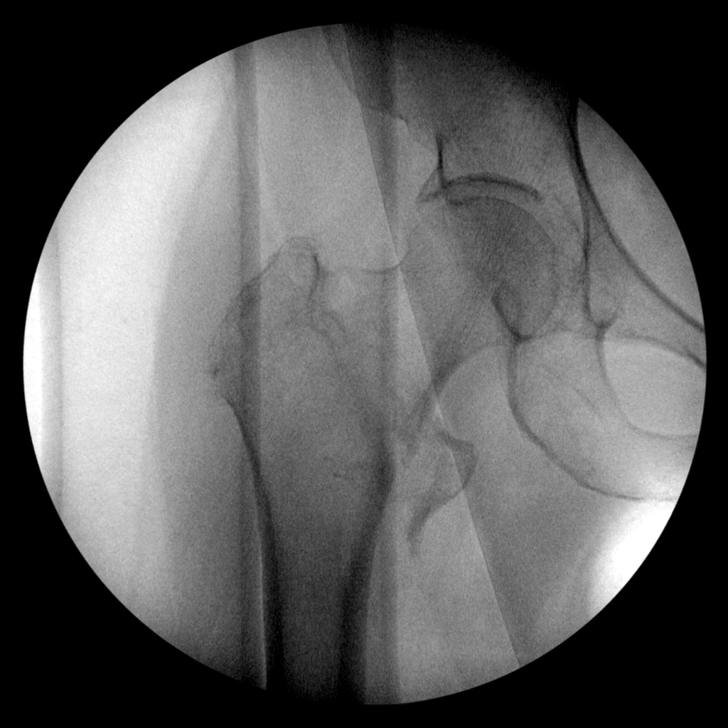
[im 2/11]
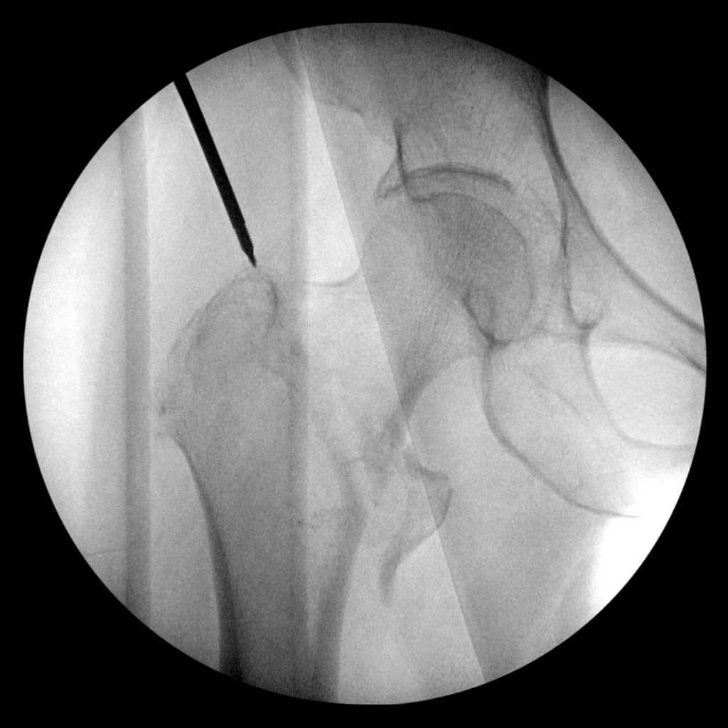
[im 3/11]
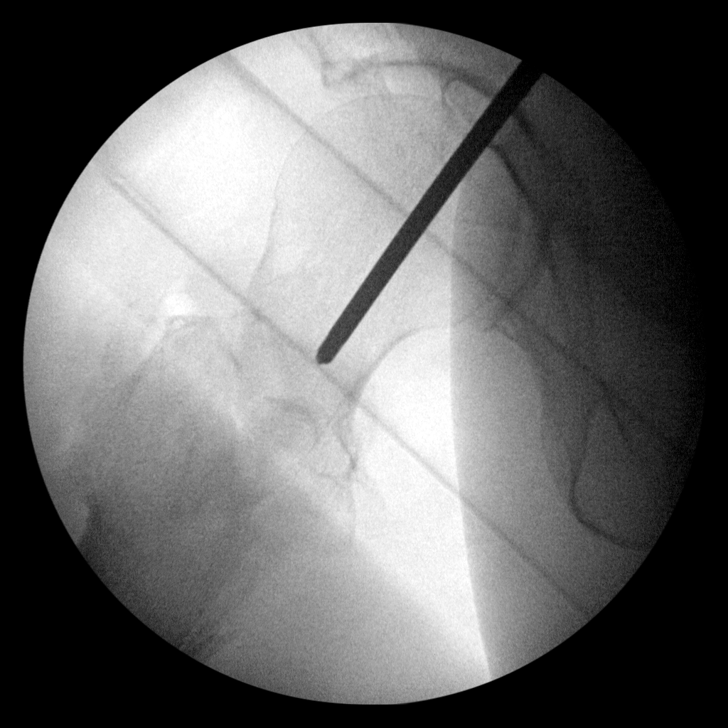
[im 4/11]
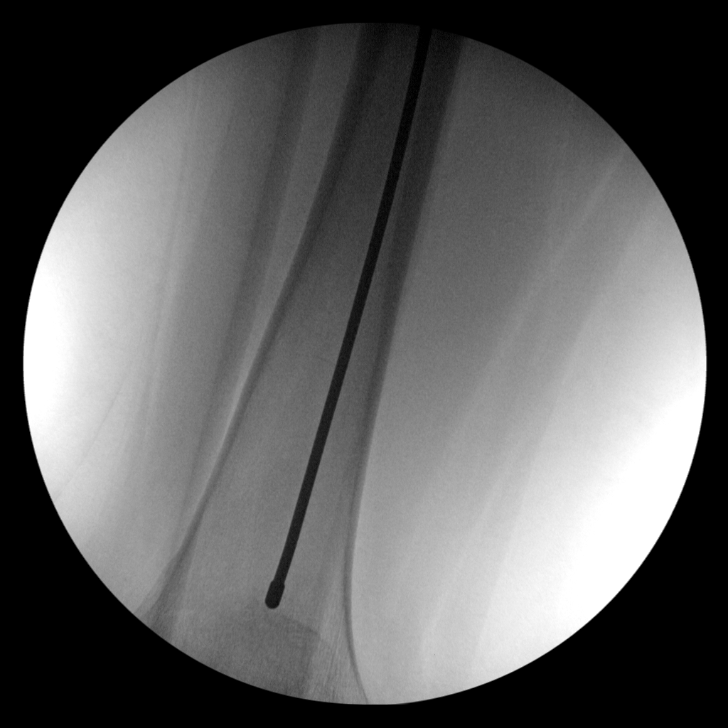
[im 5/11]
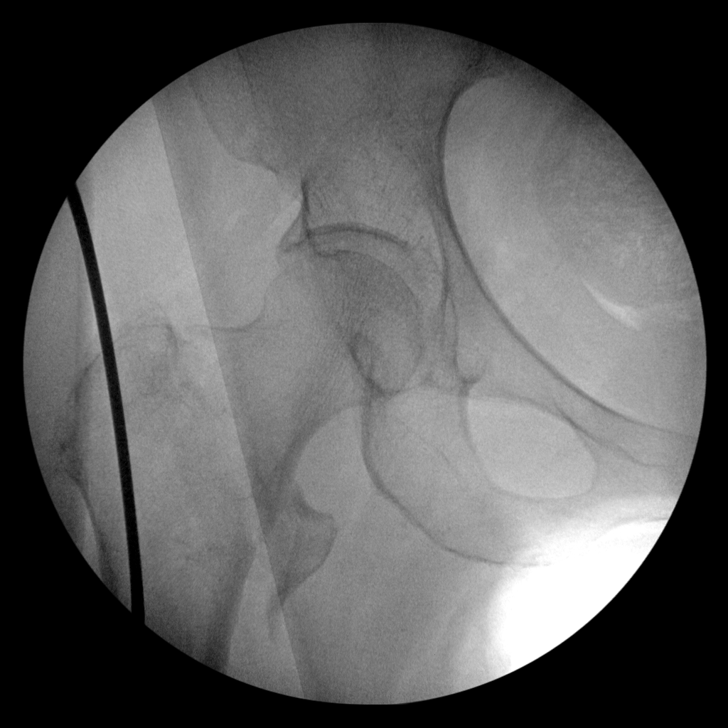
[im 6/11]
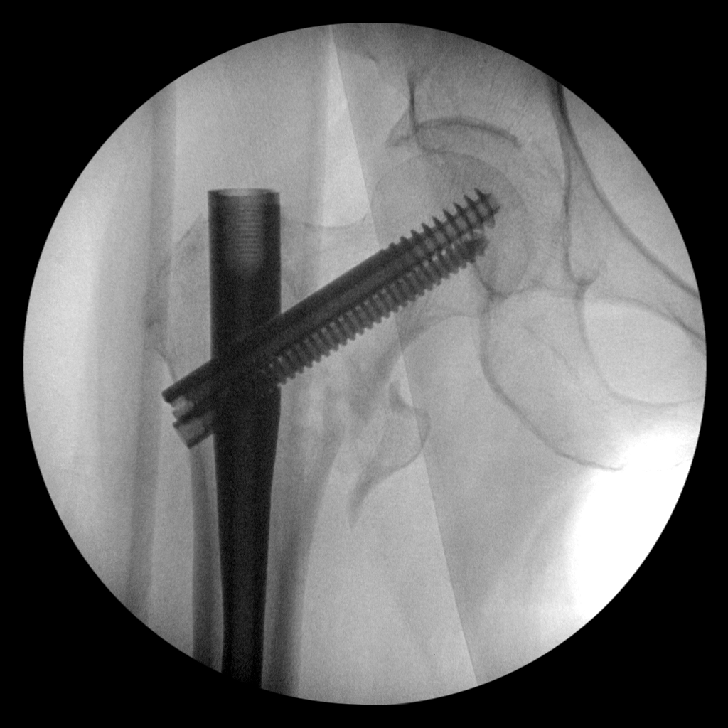
[im 7/11]
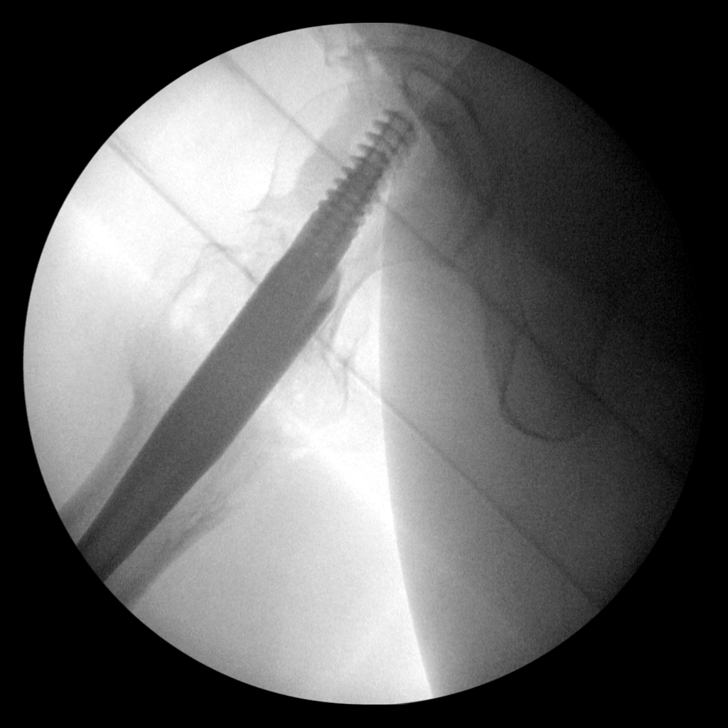
[im 8/11]
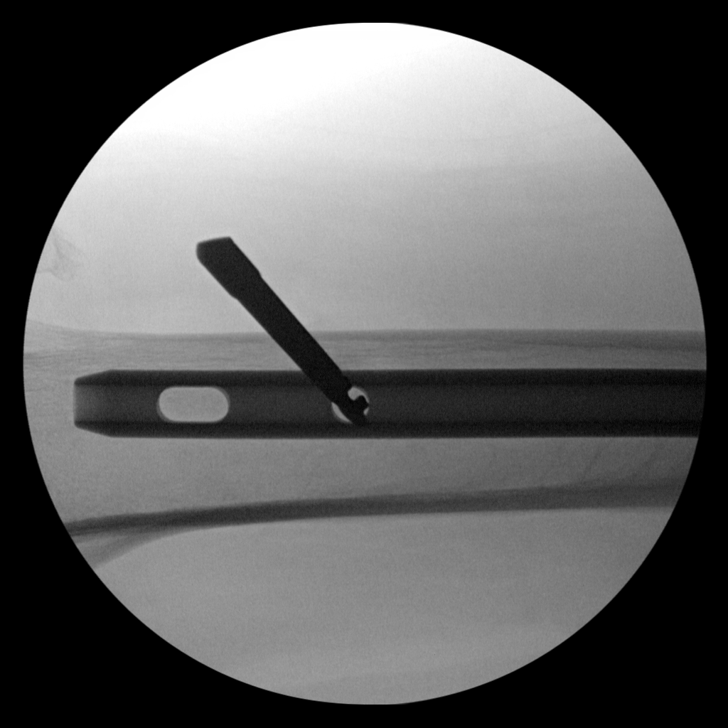
[im 9/11]
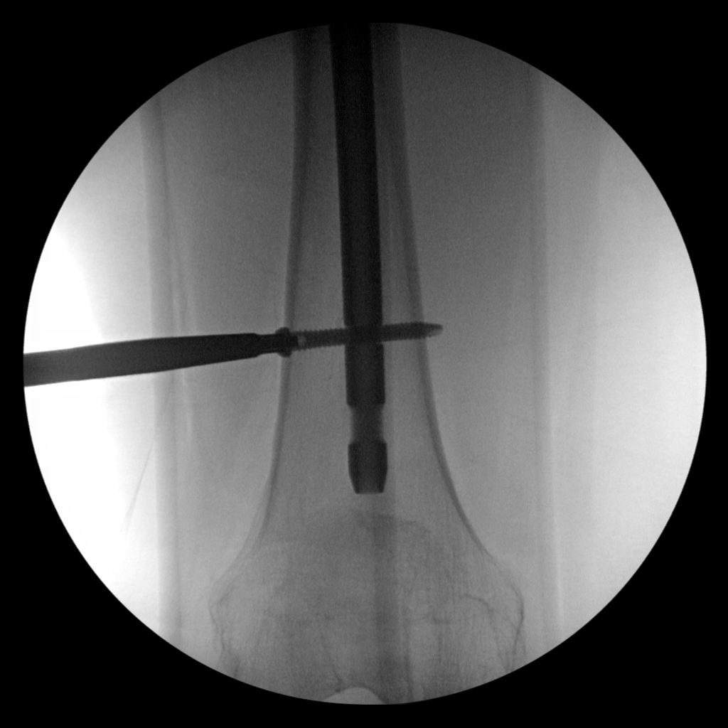
[im 10/11]
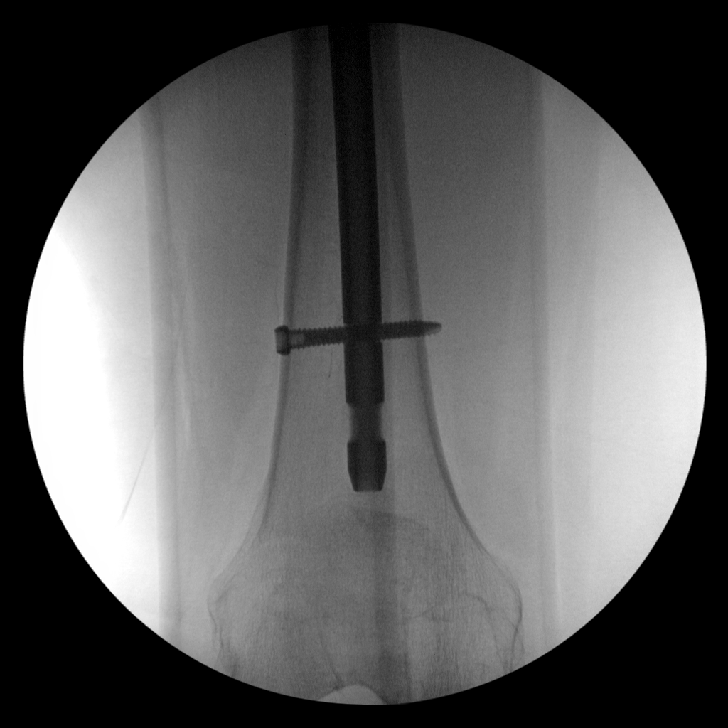
[im 11/11]
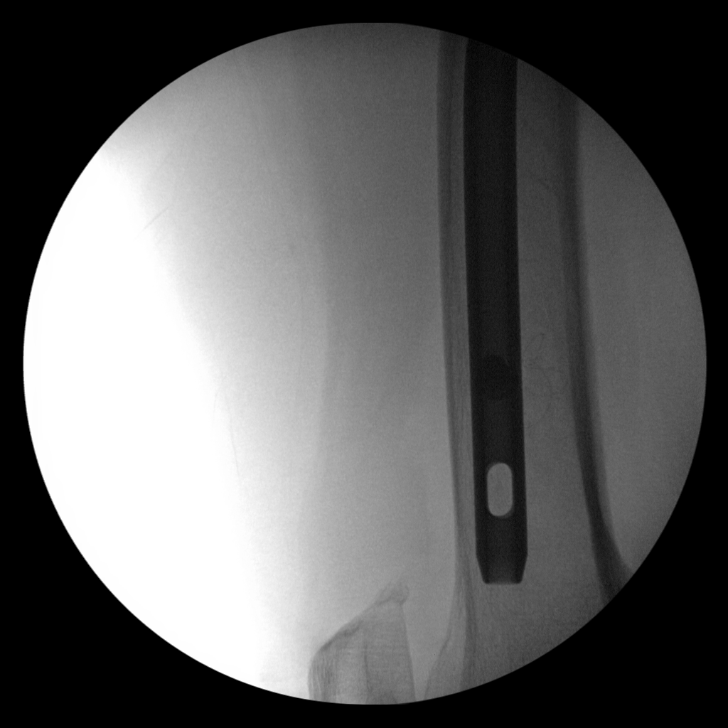

[11 of 11 positions shown; findings below may reference images not displayed]

FINDINGS: Intraoperative fluoroscopic images demonstrate intramedullary nail
fixation of intratrochanteric fractures of the right hip.
IMPRESSION: Intraoperative fluoroscopic images demonstrate intramedullary nail
fixation of intratrochanteric fractures of the right hip.

## 2024-04-27 ENCOUNTER — Ambulatory Visit: Admitting: Dermatology
